# Patient Record
Sex: Male | Born: 1965 | Hispanic: Yes | Marital: Married | Smoking: Never smoker
Health system: Southern US, Community
[De-identification: ages and names within clinical notes are randomized; demographics above are authoritative.]

## PROBLEM LIST (undated history)

## (undated) DIAGNOSIS — R7302 Impaired glucose tolerance (oral): Secondary | ICD-10-CM

## (undated) DIAGNOSIS — E785 Hyperlipidemia, unspecified: Secondary | ICD-10-CM

## (undated) DIAGNOSIS — I10 Essential (primary) hypertension: Secondary | ICD-10-CM

## (undated) HISTORY — DX: Impaired glucose tolerance (oral): R73.02

## (undated) HISTORY — DX: Essential (primary) hypertension: I10

## (undated) HISTORY — DX: Hyperlipidemia, unspecified: E78.5

---

## 2010-04-07 ENCOUNTER — Inpatient Hospital Stay (HOSPITAL_COMMUNITY)
Admission: EM | Admit: 2010-04-07 | Discharge: 2010-04-20 | Payer: Self-pay | Source: Home / Self Care | Attending: Internal Medicine | Admitting: Internal Medicine

## 2010-04-07 ENCOUNTER — Encounter (INDEPENDENT_AMBULATORY_CARE_PROVIDER_SITE_OTHER): Payer: Self-pay | Admitting: *Deleted

## 2010-04-08 ENCOUNTER — Encounter (INDEPENDENT_AMBULATORY_CARE_PROVIDER_SITE_OTHER): Payer: Self-pay | Admitting: *Deleted

## 2010-04-10 ENCOUNTER — Encounter (INDEPENDENT_AMBULATORY_CARE_PROVIDER_SITE_OTHER): Payer: Self-pay | Admitting: Internal Medicine

## 2010-04-12 ENCOUNTER — Encounter (INDEPENDENT_AMBULATORY_CARE_PROVIDER_SITE_OTHER): Payer: Self-pay | Admitting: *Deleted

## 2010-04-12 ENCOUNTER — Encounter: Payer: Self-pay | Admitting: Internal Medicine

## 2010-04-12 ENCOUNTER — Encounter (INDEPENDENT_AMBULATORY_CARE_PROVIDER_SITE_OTHER): Payer: Self-pay | Admitting: Internal Medicine

## 2010-04-14 ENCOUNTER — Encounter: Payer: Self-pay | Admitting: Internal Medicine

## 2010-04-18 ENCOUNTER — Encounter (INDEPENDENT_AMBULATORY_CARE_PROVIDER_SITE_OTHER): Payer: Self-pay | Admitting: *Deleted

## 2010-04-18 LAB — BASIC METABOLIC PANEL
BUN: 7 mg/dL (ref 6–23)
CO2: 25 mEq/L (ref 19–32)
Calcium: 7.8 mg/dL — ABNORMAL LOW (ref 8.4–10.5)
Chloride: 105 mEq/L (ref 96–112)
Creatinine, Ser: 0.58 mg/dL (ref 0.4–1.5)
GFR calc Af Amer: >60 mL/min (ref >60)
GFR calc non Af Amer: >60 mL/min (ref >60)
Glucose, Bld: 117 mg/dL — ABNORMAL HIGH (ref 70–99)
Potassium: 3.6 mEq/L (ref 3.5–5.1)
Sodium: 137 mEq/L (ref 135–145)

## 2010-04-18 LAB — CBC
HCT: 30.6 % — ABNORMAL LOW (ref 39.0–52.0)
Hemoglobin: 10.3 g/dL — ABNORMAL LOW (ref 13.0–17.0)
MCH: 30.7 pg (ref 26.0–34.0)
MCHC: 33.7 g/dL (ref 30.0–36.0)
MCV: 91.1 fL (ref 78.0–100.0)
Platelets: 493 10*3/uL — ABNORMAL HIGH (ref 150–400)
RBC: 3.36 MIL/uL — ABNORMAL LOW (ref 4.22–5.81)
RDW: 14.3 % (ref 11.5–15.5)
WBC: 14.9 10*3/uL — ABNORMAL HIGH (ref 4.0–10.5)

## 2010-04-18 LAB — GLUCOSE, CAPILLARY
Glucose-Capillary: 121 mg/dL — ABNORMAL HIGH (ref 70–99)
Glucose-Capillary: 139 mg/dL — ABNORMAL HIGH (ref 70–99)
Glucose-Capillary: 118 mg/dL — ABNORMAL HIGH (ref 70–99)

## 2010-04-18 LAB — HEPATIC FUNCTION PANEL
ALT: 40 U/L (ref 0–53)
AST: 37 U/L (ref 0–37)
Albumin: 2 g/dL — ABNORMAL LOW (ref 3.5–5.2)
Alkaline Phosphatase: 138 U/L — ABNORMAL HIGH (ref 39–117)
Bilirubin, Direct: 0.3 mg/dL (ref 0.0–0.3)
Indirect Bilirubin: 0.4 mg/dL (ref 0.3–0.9)
Total Bilirubin: 0.7 mg/dL (ref 0.3–1.2)
Total Protein: 5.5 g/dL — ABNORMAL LOW (ref 6.0–8.3)

## 2010-04-19 ENCOUNTER — Encounter (INDEPENDENT_AMBULATORY_CARE_PROVIDER_SITE_OTHER): Payer: Self-pay | Admitting: *Deleted

## 2010-04-19 LAB — CBC
HCT: 32 % — ABNORMAL LOW (ref 39.0–52.0)
Hemoglobin: 10.9 g/dL — ABNORMAL LOW (ref 13.0–17.0)
MCH: 31.1 pg (ref 26.0–34.0)
MCHC: 34.1 g/dL (ref 30.0–36.0)
MCV: 91.2 fL (ref 78.0–100.0)
Platelets: 599 10*3/uL — ABNORMAL HIGH (ref 150–400)
RBC: 3.51 MIL/uL — ABNORMAL LOW (ref 4.22–5.81)
RDW: 14.3 % (ref 11.5–15.5)
WBC: 15.6 10*3/uL — ABNORMAL HIGH (ref 4.0–10.5)

## 2010-04-19 LAB — PHOSPHORUS: Phosphorus: 4.4 mg/dL (ref 2.3–4.6)

## 2010-04-19 LAB — GLUCOSE, CAPILLARY: Glucose-Capillary: 131 mg/dL — ABNORMAL HIGH (ref 70–99)

## 2010-04-19 LAB — MAGNESIUM: Magnesium: 2.3 mg/dL (ref 1.5–2.5)

## 2010-04-20 ENCOUNTER — Encounter (INDEPENDENT_AMBULATORY_CARE_PROVIDER_SITE_OTHER): Payer: Self-pay | Admitting: *Deleted

## 2010-04-20 LAB — CBC
HCT: 30.3 % — ABNORMAL LOW (ref 39.0–52.0)
Hemoglobin: 10.2 g/dL — ABNORMAL LOW (ref 13.0–17.0)
MCH: 30.9 pg (ref 26.0–34.0)
MCHC: 33.7 g/dL (ref 30.0–36.0)
MCV: 91.8 fL (ref 78.0–100.0)
Platelets: 729 10*3/uL — ABNORMAL HIGH (ref 150–400)
RBC: 3.3 MIL/uL — ABNORMAL LOW (ref 4.22–5.81)
RDW: 14.5 % (ref 11.5–15.5)
WBC: 19.4 10*3/uL — ABNORMAL HIGH (ref 4.0–10.5)

## 2010-04-20 LAB — CMV IGM: CMV IgM: 0.12 (ref <0.90)

## 2010-04-20 LAB — CYTOMEGALOVIRUS ANTIBODY, IGG: Cytomegalovirus(CMV) Antibody, IgG: POSITIVE — AB

## 2010-04-23 ENCOUNTER — Encounter (INDEPENDENT_AMBULATORY_CARE_PROVIDER_SITE_OTHER): Payer: Self-pay | Admitting: *Deleted

## 2010-04-30 LAB — QUANTIFERON TB GOLD ASSAY (BLOOD)
Mitogen Minus Nil Value: 0.02 IU/mL
Quantiferon Nil Value: 0.18 IU/mL
TB Antigen Minus Nil Value: <0 IU/mL

## 2010-04-30 LAB — CMV (CYTOMEGALOVIRUS) DNA ULTRAQUANT, PCR: CMV DNA Quant: <200 copies/mL (ref <200)

## 2010-04-30 LAB — ANTI-NEUTROPHIL ANTIBODY

## 2010-05-17 NOTE — Letter (Signed)
Summary: New Patient letter  [facility]  [address] [place], [place] [address]   Phone: [phone]  Fax: [phone]       04/20/2010 MRN: [id number]  Edward Vasquez [address] [place], [place]  [address]  Dear Edward Vasquez,  Welcome to the Gastroenterology Division at [facility].    You are scheduled to see Dr.  Claudette Head on May 29, 2010 at 11:00am on the 3rd floor at [facility], [address]  We ask that you try to arrive at our office 15 minutes prior to your appointment time to allow for check-in.  We would like you to complete the enclosed self-administered evaluation form prior to your visit and bring it with you on the day of your appointment.  We will review it with you.  Also, please bring a complete list of all your medications or, if you prefer, bring the medication bottles and we will list them.  Please bring your insurance card so that we may make a copy of it.  If your insurance requires a referral to see a specialist, please bring your referral form from your primary care physician.  Co-payments are due at the time of your visit and may be paid by cash, check or credit card.     Your office visit will consist of a consult with your physician (includes a physical exam), any laboratory testing he/she may order, scheduling of any necessary diagnostic testing (e.g. x-ray, ultrasound, CT-scan), and scheduling of a procedure (e.g. Endoscopy, Colonoscopy) if required.  Please allow enough time on your schedule to allow for any/all of these possibilities.    If you cannot keep your appointment, please call [phone] to cancel or reschedule prior to your appointment date.  This allows [place] the opportunity to schedule an appointment for another patient in need of care.  If you do not cancel or reschedule by 5 p.m. the business day prior to your appointment date, you will be charged a $50.00 late cancellation/no-show fee.    Thank you for  choosing [facility] for your medical needs.  We appreciate the opportunity to care for you.  Please visit [place] at our website  to learn more about our practice.                     Sincerely,                                                             The Gastroenterology Division

## 2010-05-17 NOTE — Procedures (Signed)
Summary: Upper Endoscopy  Patient: Edward Vasquez Note: All result statuses are Final unless otherwise noted.  Tests: (1) Upper Endoscopy (EGD)   EGD Upper Endoscopy       DONE     [facility]     [address] [place], [place]  [address]           ENDOSCOPY PROCEDURE REPORT           PATIENT:  [name], [name]  MR#:  [id number]     BIRTHDATE:  [birth year], 44 yrs. old  GENDER:  male           ENDOSCOPIST:  Hedwig Morton. Juanda Chance, MD     Referred by:           PROCEDURE DATE:  04/12/2010     PROCEDURE:  EGD with biopsy, 19147     ASA CLASS:  Class II     INDICATIONS:  GERD, hematemesis           MEDICATIONS:   Versed 9 mg, Fentanyl 100 mcg, Benadryl 25 mg     TOPICAL ANESTHETIC:  Cetacaine Spray           DESCRIPTION OF PROCEDURE:   After the risks benefits and     alternatives of the procedure were thoroughly explained, informed     consent was obtained.  The  endoscope was introduced through the     mouth and advanced to the second portion of the duodenum, without     limitations.  The instrument was slowly withdrawn as the mucosa     was fully examined.     <<PROCEDUREIMAGES>>           Multiple ulcers were found in the descending duodenum. multiple     shallow ulcerations in the descending duodenum, not bleeding     Multiple biopsies were obtained and sent to pathology (see image1,     image2, and image3).  gastric varices (see image5). NG tube     induced gastritis  Esophagitis was found in the distal esophagus     (see image7 and image6).    Retroflexed views revealed no     abnormalities.    The scope was then withdrawn from the patient     and the procedure completed.           COMPLICATIONS:  None           ENDOSCOPIC IMPRESSION:     1) Ulcers, multiple in the descending duodenum     2) Gastric varices     3) Esophagitis in the distal esophagus     RECOMMENDATIONS:     1) Await biopsy results     D/C NG tube, PPI, Colonoscopy           REPEAT EXAM:  In 0  year(s) for.           ______________________________     Hedwig Morton. Juanda Chance, MD           CC:           n.     eSIGNED:   Hedwig Morton. Sumie Remsen at 04/12/2010 02:18 PM           Dorthy Cooler, [id number]  Note: An exclamation mark (!) indicates a result that was not dispersed into the flowsheet. Document Creation Date: 04/12/2010 2:18 PM _______________________________________________________________________  (1) Order result status: Final Collection or observation date-time: 04/12/2010 14:13 Requested date-time:  Receipt date-time:  Reported date-time:  Referring Physician:   Ordering Physician: Lina Sar [phone]) Specimen Source:  Source: Launa Grill Order Number: [id number] Lab site:

## 2010-05-17 NOTE — Procedures (Signed)
Summary: Colonoscopy  Patient: [name] Note: All result statuses are Final unless otherwise noted.  Tests: (1) Colonoscopy (COL)   COL Colonoscopy           DONE     [facility]     [address] [place], [place]  [address]           COLONOSCOPY PROCEDURE REPORT           PATIENT:  Edward Vasquez, Edward Vasquez  MR#:  [id number]     BIRTHDATE:  [birth year], 44 yrs. old  GENDER:  male     ENDOSCOPIST:  Hedwig Morton. Juanda Chance, MD     REF. BY:     PROCEDURE DATE:  04/12/2010     PROCEDURE:  Colonoscopy with biopsy     ASA CLASS:  Class II     INDICATIONS:  unexplained diarrhea, hematochezia, Abnormal CT of     abdomen, Abdominal pain     MEDICATIONS:   Versed 2 mg, Fentanyl 50 mcg, Benadryl 25 mg           DESCRIPTION OF PROCEDURE:   After the risks benefits and     alternatives of the procedure were thoroughly explained, informed     consent was obtained.  Digital rectal exam was performed and     revealed no rectal masses.   The  endoscope was introduced through     the anus and advanced to the cecum, which was identified by both     the appendix and ileocecal valve, without limitations.  The     quality of the prep was none.  The instrument was then slowly     withdrawn as the colon was fully examined.     <<PROCEDUREIMAGES>>           FINDINGS:  Colitis was found. patchy erythema, throughout,     discrete ulcerations at the ileocecal valve, lot of tissue and     blood tinged mucous from TIleum which could not be entered     Multiple biopsies were obtained and sent to pathology (see image1,     image2, image3, image4, image5, and image6).   Retroflexed views     in the rectum revealed no abnormalities.    The scope was then     withdrawn from the patient and the procedure completed.           COMPLICATIONS:  None     ENDOSCOPIC IMPRESSION:     1) Colitis     acute colitis, and suspected enteritis, TI unable to intubate,     picture not suggestive of Crohn's disease  RECOMMENDATIONS:     1) Await pathology results     see chart for recommendations     REPEAT EXAM:  In 0 year(s) for.           ______________________________     Hedwig Morton. Juanda Chance, MD           CC:           n.     eSIGNED:   Hedwig Morton. Rhonin Trott at 04/12/2010 03:02 PM           Dorthy Cooler, [id number]  Note: An exclamation mark (!) indicates a result that was not dispersed into the flowsheet. Document Creation Date: 04/12/2010 3:02 PM _______________________________________________________________________  (1) Order result status: Final Collection or observation date-time: 04/12/2010 14:46 Requested date-time:  Receipt date-time:  Reported date-time:  Referring Physician:   Ordering  Physician: Lina Sar [id number]) Specimen Source:  Source: Launa Grill Order Number: [id number] Lab site:

## 2010-05-19 NOTE — Consult Note (Signed)
  Edward Vasquez, Edward Vasquez NO.:  [id number]  MEDICAL RECORD NO.:  [id number]          PATIENT TYPE:  INP  LOCATION:  1516                         FACILITY[facility]  [facility]  PHYSICIAN:  Acey Lav, MD  DATE OF BIRTH:  03-Sep-1965  DATE OF CONSULTATION: DATE OF DISCHARGE:                                CONSULTATION   PROCEDURE:  Punch biopsy.  PERMISSION:  The patient's informed consent was obtained after the risks and benefits of the procedure were thoroughly explained to him.  INDICATIONS:  This is to try and diagnose what the cause of his petechial purpuric rash and systemic illness is.  DESCRIPTION OF PROCEDURE:  The patient's left medial thigh was prepped in the usual sterile manner with Betadine, 1% lidocaine was used to infiltrate three different sites in the thigh.  Punch biopsies were obtained from three different sites.  The first punch biopsy was obtained.  The tissue seemed to have been lost within one of the instruments, therefore, we had to do two additional biopsies.  The second punch biopsy was obtained and put into a sterile container to be sent for AFB, fungal and bacterial cultures.  Final specimen taken was sent to the Pathology laboratory for pathological examination.  The patient had minimal blood loss.  COMPLICATIONS:  None.     Acey Lav, MD     CV/MEDQ  D:  04/09/2010  T:  04/09/2010  Job:  [id number]  Electronically Signed by Paulette Blanch DAM MD on 05/19/2010 09:52:55 AM

## 2010-05-19 NOTE — Consult Note (Signed)
Edward, Vasquez NO.:  [id number]  MEDICAL RECORD NO.:  [id number]          PATIENT TYPE:  INP  LOCATION:  1516                         FACILITY:  [facility]  PHYSICIAN:  Acey Lav, MD  DATE OF BIRTH:  Nov 02, 1965  DATE OF CONSULTATION:  04/08/2010 DATE OF DISCHARGE:                                CONSULTATION   REQUESTING PHYSICIAN:  Lonia Blood, MD.  REASON FOR INFECTIOUS DISEASE CONSULTATION:  The patient with fever, petechial/purpuric rash, abdominal pain.  Findings concerning for possible Crohn's disease and bloody bowel movements.  HISTORY OF PRESENT ILLNESS:  Edward Vasquez is a 45 year old [place] American gentleman who immigrated to the [place] about 24 years ago, who works in [employer].  He states that in 1990, he has had a gastrointestinal infection but otherwise had no chronic gastrointestinal complaints.  This past Monday, he developed acute onset of a rash in his lower extremities, was preceded by cramping sensation in his calves and then he developed a petechial rash in that area, which then ascended. He was seen in [facility] on Tuesday and given a shot of cortisone.  He was also sent home on some prednisone on December 20.  He felt initial improvement in his rash after the injection of cortisone but this was short-lived.  He continued to feel poorly and the rash worsened.  He was then seen on December 22, again in Urgent Care, and given doxycycline as well as Lortab.  Despite the doxycycline that he then took, he continued to feel poorly and the rash ascended from his lower extremities up his thighs now involving his arms, sparing the palms of his hands, although with some solar involvement.  He then also began to develop fever as well as lower abdominal pain that was cramping as well as bloody bowel movements up to 3 or 4 per day and this began on Christmas Eve on the 24th.  He came the emergency department for evaluation.   He then had a workup done which included abdominal films which suggested a possible left retrocardiac, left lower lobe infiltrate on chest x-ray.  He had a CT of the abdomen and pelvis done as well that evening.  This showed edema in a lengthy section of the distal and terminal ileum with stranding of the surrounding fat, conspicuous for possible inflammatory bowel disease versus an infectious etiology.  He had some free fluid in the pelvis and also some atelectasis of the left lower lobe and a small pleural effusion.  Radiology felt that it could not exclude early pneumonia based on the CT scan yet.  The patient's pertinent labs showed him to have a  normal white count of 8.5, normal hemoglobin, platelets.  Slightly elevated lactic acid at 2.7, normal procalcitonin.  He was initiated on vancomycin and Zosyn in the emergency room, then changed over to cefepime and intravenous doxycycline.  Of note, the patient relates to me additional history of having transient severe muscle weakness to the point where he could not walk.  He states that his wife and friends had to "carry him" to get  him to the Urgent Care on Wednesday.  This muscle weakness has since improved but it was quite severe earlier in the week.  The patient has had laboratory testing done so far including HIV antibody which has been negative, and syphilis titer which was negative.  We were asked to assist in the workup of this patient with fever and petechial/purpuric rash, abdominal pain, findings concerning for enteritis in the terminal ileum, as well as possible left lower lobe infiltrate concerning for pneumonia.  PAST MEDICAL HISTORY: 1. Isolated episode of diarrheal illness in [place] in the 1990s. 2. Hypertension.  ALLERGIES:  NO KNOWN DRUG ALLERGIES.  MEDICATIONS:  Medications prior to arrival in the hospital here at [facility] included the prednisone that he had been given, doxycycline, Nexium, Norvasc and  Vicodin.  MEDICATIONS IN-HOUSE:  At present include normal saline, Dilaudid, Zofran, and oxycodone.  IMPRESSION:  This is a complicated 45 year old Hispanic gentleman with seemingly no significant past medical history who presents with 1-week history of an ascending petechial/purpuric rash that began in his lower calves and ascended upwards, involved thighs and arms, also accompanied by transient severe weakness, which was treated with steroids and doxycycline at [facility].  Despite this, the patient had worsening of the rash as well as new onset of abdominal pain and fever Saturday, found on imaging to have a left retrocardiac opacity on chest x-ray, concerning for atelectasis versus pneumonia and found to have thickening of the terminal ileum, seen on CT scan of the abdomen and pelvis. 1. Fever with petechial rash, infiltrate on chest x-ray, diffuse     ileitis seen on CT scan, bloody diarrhea, also with history of     transient severe lower extremity weakness.  It is not clear what     the unifying diagnosis would be in this gentleman at this point in     time.  Certainly, Campylobacter infection could cause an enteritis,     could be accompanied by a rash and could also result in Guillain-     Barre syndrome.  The timing, however, does not match up in the     classic manner in that his rash preceded his development of     gastrointestinal symptoms.  He did also have transient episodes of     severe muscle weakness.  Certainly, given the petechial and     purpuric nature of the rash, I certainly would have agreed with     treating the patient with doxycycline particularly in a month where     RMSF would be much more likely to be in play.  However, this     patient has failed to improve despite doxycycline which would go     against the diagnosis of [facility] spotted fever.  His     laboratory findings are also not suggestive of [facility]     spotted fever with  normal white count, normal hemoglobin and normal     platelets.  Similarly, ehrlichiosis seems unlikely.  Certainly,     given the petechial nature of the rash, I would be concerned about     meningococcus or disseminated gonococcal infection or other severe     disseminated infections such as infection with group A strep or     pneumococcus.  He does not seem to have much of the risk factors     for meningococcemia and does not have much in the way of headache     at this point in  time although he states he has had some stiff neck     over the last several weeks.  Disseminated gonococcal disease is     possible although I do not know how to fit the gastrointestinal     complaints into that illness other than if he had a severe     bacteremia and sepsis.  Certainly, pneumococcal infection remains     in the differential given the left retrocardiac opacity and high     fevers.  Other possibilities would include inflammatory bowel     disease.  Inflammatory bowel disease can present with a rash     although usually with erythema nodosum rather than a     petechial/purpuric rash.  However, this is in the differential.  It     is unusual that he has had no similar episodes other than one in     1990s.  Certainly an autoimmune condition is possible.  His     sedimentation rate is not terribly impressive, but something such     as dermatomyositis would be in the differential although he does     not have classic heliotrope rash.  For the time being, I would     recommend following with the patient off antibiotics.  First reason     for this is that I do not know what we  are treating.  Certainly,     should he have infection with SHIGA toxin-producing Escherichia     coli or shigellosis, antibiotics should make this worse.  He has     received some antibiotics in the ER and this has not yet pushed him     towards hemolytic-uremic syndrome.  However, I think we will follow     him off  antibiotics for now.  Certainly, should his condition     deteriorate, I would then give him antibiotics and would likely     give him Rocephin and Flagyl.  This would cover pneumococcus,     disseminated meningococcal  infection and disseminated gonococcal     infection, and as well as gastrointestinal pathogens.  I think     [facility] spotted fever and Ehrlichia are unlikely given the     lack of response to doxycycline.  I will send off an HIV RNA.     Hepatitis serologies have been sent.  I will send off Epstein-Barr     virus and cytomegalovirus antibody.  I will recheck a procalcitonin     the morning.  I will check a urine pneumococcal antigen.  I will     check urine gonococcal and Chlamydia.  I will check QuantiFERON-     Gold to look for possible tuberculosis infection of the terminal     ileum.  I will recheck a chest x-ray in the morning.  I would like     to get an MRI to better evaluate this transient weakness that he     has as well.  I will check ANCA, ferritin, and anti-Saccharomyces     cerevisiae antibody. 2. Bloody diarrhea.  Imaging shows inflammation in the ileum.     Differential does include inflammatory bowel diseases, does include     infection including pseudo tuberculosis with infection with     yersinia, also does include tuberculosis itself.  Infection with     enteric pathogens such as Escherichia coli and SHIGA toxin-     producing Escherichia coli are possible as is Campylobacter,  Salmonella.  I agree with getting stool cultures along with stool     white blood cells.  This patient ultimately may need an upper     endoscopy with push through versus colonoscopy to better the     evaluate the terminal ileum.  Again, for now I would withhold     antibiotics and observe the patient. 3. Ascending petechial rash.  Again as mentioned above, differential     for this is broad.  Does include infectious etiology that has been     discussed, such as severe  pneumococcal infection, severe gonococcal     infection, severe rickettsial infection.  It also includes other     infectious diseases such as HIV or hepatitis C, Epstein-Barr virus,     cytomegalovirus.  Certainly, autoimmune disease does need to be     considered.  Something such as dermatomyositis would be in the     differential as discussed above and labs are going to be send off     for this, including a CPK, aldolase and LDH.  He may benefit from a     biopsy of the rash, although at this point in time I do not know     for sure much beyond the petechiae that are present.  He might     benefit from a dermatology consult though. 4. Transient weakness of lower extremities.  I will get an MRI of the     lower lumbar spine to better evaluate this.  It is not clear what     the cause of this was or this is just severe weakness from     infection and dehydration or whether he might have had some     transient transverse myelitis or other process that might respond     to steroids.  The clinical picture is certainly confusing.  Thank you for the Infectious Disease consultation.  I will follow along closely.  I will be here tomorrow and then Dr. Orvan Falconer will be here on Tuesday.     Acey Lav, MD     CV/MEDQ  D:  04/08/2010  T:  04/08/2010  Job:  [id number]  cc:   Lonia Blood, M.D.  Barbette Hair. Arlyce Dice, MD,FACG [address] [place] [address]  Electronically Signed by Paulette Blanch DAM MD on 05/19/2010 09:52:46 AM

## 2010-05-29 ENCOUNTER — Ambulatory Visit: Payer: Self-pay | Admitting: Gastroenterology

## 2010-05-29 ENCOUNTER — Ambulatory Visit (INDEPENDENT_AMBULATORY_CARE_PROVIDER_SITE_OTHER): Payer: BC Managed Care – PPO | Admitting: Gastroenterology

## 2010-05-29 ENCOUNTER — Encounter: Payer: Self-pay | Admitting: Gastroenterology

## 2010-05-29 DIAGNOSIS — K5289 Other specified noninfective gastroenteritis and colitis: Secondary | ICD-10-CM

## 2010-05-29 DIAGNOSIS — K644 Residual hemorrhoidal skin tags: Secondary | ICD-10-CM | POA: Insufficient documentation

## 2010-05-31 NOTE — Discharge Summary (Signed)
Summary: Small bowel enteritis with jejunitis    NAME:  Edward Vasquez, Edward Vasquez                  ACCOUNT NO.:  [id number]      MEDICAL RECORD NO.:  [id number]          PATIENT TYPE:  INP      LOCATION:  1319                         FACILITY:  [facility]      PHYSICIAN:  Lonia Blood, M.D.       DATE OF BIRTH:  20-Jul-1965      DATE OF ADMISSION:  04/07/2010   DATE OF DISCHARGE:  04/20/2010                                  DISCHARGE SUMMARY         DISCHARGE DIAGNOSES:   1. Small bowel enteritis with jejunitis, ileitis, duodenitis with       severe bleeding and inflammation.  Pathology report indicating       inflamed small bowel type mucosa with ischemic-type changes with       overall features favoring an ischemic process.  Negative for       chronic inflammatory bowel disease.   2. Rash of unclear etiology.  Biopsy indicating perivascular       neutrophilic dermatitis. Question Sweet syndrome.   3. Hypertension.      DISCHARGE MEDICATIONS:   1. Prednisone taper 40 mg for 3 days, 20 mg for 3 days, 10 mg for 3       days.   2. Amlodipine 5 mg daily.   3. Hydrocodone/APAP 5/500 mg 1-2 tablets every 6 hours as needed for       pain.   4. Nexium 40 mg daily.   5. Zyrtec 10 mg daily.      CONDITION ON DISCHARGE:  [name] was discharged in good condition.  He   will follow up with Dr. Kellie Simmering from Rheumatology and Dr. Russella Dar from   Gastroenterology.      HISTORY AND PHYSICAL:  Refer to dictated H and P note done by Dr.   Della Goo.      HOSPITAL COURSE:  [name] was admitted to [facility] and   throughout this admission, he had a course initially of worsening   symptoms.  His initial CT scan of the abdomen done on April 07, 2010,   revealed presence of mucosal edema in the terminal ileum.  He had done   repeat CT scan of his abdomen on April 14, 2010, it was negative for   superior mesenteric artery thrombi or superior mesenteric vein clots.   He was noticed that small  bowel enteritis as progress from the ileum to   also the jejunum and duodenum.  The patient was seen in consultation by   Dr. Daiva Eves from Infectious Disease, Dr. Lina Sar from   Gastroenterology and Dr. Stacey Drain, from Rheumatology.  He had   multiple studies done including an ANA, which resulted back negative on   April 08, 2010; a CMV panel, which was negative for IgM; [facility] spotted fever panel, which was negative; Ehrlichia panel, which   was negative; HIV RNA, which was negative; ANCA panel, which was   negative; Cryoglobulin evaluation negative; antiphospholipid  antibody   syndrome panel, which did not indicate presence of any APLA-type   antibodies, please note that this panel was initially reported as   positive, but that was a false positive and not a true positive as   confirmatory lab later did not confirm the diagnosis.  Overall though,   based on the results of the biopsies, the testing that we done and the   patient's course of illness, we felt that this was most likely a   something infections that started and reactive process resulting in this   extensive inflammatory response in him to small bowel.  The patient   mainly improve on his own without any treatment except empiric   antibiotics for an infection that we never proven to exist.  Multiple   stool cultures were negative for Shigella, Salmonella, or Campylobacter.   Hepatitis A, B, and C were also negative.  By the time, the patient was   seen by Dr. Kellie Simmering and   was started on some of prednisone, where did resolve his rash and he was   without any bloody diarrhea anymore.  The patient will follow up in the   office setting with Dr. Russella Dar and Dr. Kellie Simmering to see if symptoms recur   and if he wants any further workup.               Lonia Blood, M.D.               SL/MEDQ  D:  04/22/2010  T:  04/23/2010  Job:  [id number]      Electronically Signed by Lonia Blood M.D. on 05/02/2010 08:57:26 AM

## 2010-05-31 NOTE — Consult Note (Signed)
Summary: Abdominal pain, rash, fever    NAME:  Edward Vasquez, Edward Vasquez                  ACCOUNT NO.:  [id number]      MEDICAL RECORD NO.:  [id number]          PATIENT TYPE:  INP      LOCATION:  1319                         FACILITY:  [facility]      PHYSICIAN:  Aundra Dubin, M.D.DATE OF BIRTH:  1966/03/12      DATE OF CONSULTATION:  04/18/2010   DATE OF DISCHARGE:                                    CONSULTATION         CHIEF COMPLAINT:  Abdominal pain, rash, fever.      [name] is a 45 year old Hispanic man who became ill approximately 5   days before being admitted on April 07, 2010.  He developed a rash   and fever, then he went to an urgent care.  He was treated with a   prednisone Dosepak.  He seemed to improve some over the days.  He went   back and was treated with an antibiotic.  By the day of admission, he   was having severe abdominal pain with diarrhea.  He later had some   vomiting.  There was about 3 to 4 day episode of some bloody diarrhea.   On admission, his hemoglobin was 15.9, creatinine 0.7, AST 57, ALT 126,   negative UA, ESR 18.  He has had 2 CTs of the abdomen, the one from   April 14, 2010, showed that there was distal small bowel enteritis   that had progressed since the prior imaging.  There was also increased   intraperitoneal free fluid related to the enteritis.  He underwent   colonoscopy on April 12, 2010, showing colitis with patchy erythema   throughout with discreet ulcerations.  The EGD found multiple ulcers in   the descending duodenum and shallow ulcerations without bleeding.  Since   being in the hospital, his albumin has been as low as 1.6 and most   recently was 2.0.  Overtime, his AST has normalized to 37, ALT 40.   Creatinine 0.8, glucose 117.  His white count was 33,000 on April 13, 2010, and has decreased to 14.9 by April 18, 2010.  I have seen written   that he has a positive ANA, but I do not find the results were titer.   He also  has positive antiphospholipid antibody values but there are not   easily located in the chart.      When discussing how he is feeling at this time, he feels that his   abdomen is slightly improved and he feels the best today.  He still   rates himself only at 20% improved.  He is not eating solid food.  He is   not having any significant nausea or diarrhea.  When he presented, he   was very sore and was achy all through his body.  This is why he went to   the Urgent Care and not so much the rash.  However, when he was   admitted, it was admitted on the  abdominal pain which was the   significant symptom.  The chart documents that he has lost approximately   10 pounds over the hospitalization.  He has not been febrile for many   days.  He did have fever of 102 near the time of presentation.      PAST MEDICAL/SURGICAL HISTORY:  Hypertension.  No reported surgeries.      MEDICATIONS:  At time of admission was prednisone, doxycycline, Nexium,   amlodipine.  He is presently on Protonix, nutritional support, Vasotec   0.62 mg q.i.d., Cipro 400 mg q.12 h., Flagyl 500 mg daily, Clinimix,   TPN, oxycodone, and hydromorphone p.r.n.      DRUG INTOLERANCE:  None.      SOCIAL HISTORY:  He is married and has two children.  He is a   Corporate investment banker.  He does not smoke or drink alcohol.      FAMILY HISTORY:  Not obtained.      PHYSICAL EXAMINATION:  VITAL SIGNS:  Present temperature 98.8, pulse 89,   respirations 17, blood pressure 123/81.   GENERAL:  He is lying in bed and is comfortable and gives a good   history.   SKIN:  The only residual rash is slightly to the inner thighs, but is   essentially resolved.   HEENT:  PERRL/EOMI.  Mouth is clear.   NECK:  Negative JVD.   LUNGS:  Clear.   HEART:  Regular.   ABDOMEN:  Slightly tender in the right upper quadrant, soft.   MUSCULOSKELETAL:  The arms as well as shoulders have good range of   motion without arthritis.  Knees full range of  motion.  The ankles have   mild edema, but were nontender.   NEUROLOGIC:  Strength is 5/5.      ASSESSMENT AND PLAN:   1. Acute enterocolitis.  [name] may have a vasculitis type of       presentation.  The biopsies of the duodenum showed no malignancy.       There were features of an ischemic process.  The differential was       fairly wide, but a vasculitis could not be ruled out.  He also had       a skin biopsy, which showed a neutrophilic infiltration.  There was       some question whether this could be LCV.  He has a vasculitic       process.  However, this seems to be reactive and I do wonder if it       could be related to a viral process.  It is quite severe and I       believe that he will respond to steroids, which I am starting.  I       have cautioned him that prednisone could leave him hungry and be       associated with weight gain, which I am hoping he will not gain       weight.   2. HTN.   3. Obesity.                  ______________________________   Aundra Dubin, M.D.               WWT/MEDQ  D:  04/18/2010  T:  04/19/2010  Job:  [id number]      Electronically Signed by Edward Vasquez M.D. on 05/03/2010 08:58:20 AM

## 2010-06-06 ENCOUNTER — Encounter: Payer: Self-pay | Admitting: Internal Medicine

## 2010-06-06 NOTE — Assessment & Plan Note (Signed)
Summary: Hosp follow up   History of Present Illness Visit Type: Follow-up Visit Primary GI MD: Elie Goody MD [facility] Primary Provider: na Requesting Provider: na Chief Complaint: Hospital f/u for enteritis. Pt states that he has hemorrhoids or a rash not sure  but denies any other GI complaints   History of Present Illness:   [name] returns today with complete resolution of his gastrointestinal complaints. He has occasional slight amount of rectal burning irritation, following a bowel movement. These symptoms occur about once per week. He completed his prednisone course as prescribed. He has not returned for followup with Dr. Kellie Simmering.   GI Review of Systems      Denies abdominal pain, acid reflux, belching, bloating, chest pain, dysphagia with liquids, dysphagia with solids, heartburn, loss of appetite, nausea, vomiting, vomiting blood, weight loss, and  weight gain.      Reports hemorrhoids.     Denies anal fissure, black tarry stools, change in bowel habit, constipation, diarrhea, diverticulosis, fecal incontinence, heme positive stool, irritable bowel syndrome, jaundice, light color stool, liver problems, rectal bleeding, and  rectal pain.   Current Medications (verified): 1)  Amlodipine Besylate 5 Mg Tabs (Amlodipine Besylate) .... 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  Alcohol Use - no Illicit Drug Use - no  Review of Systems       The patient complains of allergy/sinus.  The patient denies anemia, anxiety-new, arthritis/joint pain, back pain, blood in urine, breast changes/lumps,  change in vision, confusion, cough, coughing up blood, depression-new, fainting, fatigue, fever, headaches-new, hearing problems, heart murmur, heart rhythm changes, itching, menstrual pain, muscle pains/cramps, night sweats, nosebleeds, pregnancy symptoms, shortness of breath, skin rash, sleeping problems, sore throat, swelling of feet/legs, swollen lymph glands, thirst - excessive , urination - excessive , urination changes/pain, urine leakage, vision changes, and voice change.    Vital Signs:  Patient profile:   45 year old male Height:      63 inches Weight:      217 pounds BMI:     38.58 BSA:     2.00 Pulse rate:   88 / minute Pulse rhythm:   regular BP sitting:   132 / 84  (left arm) Cuff size:   regular  Vitals Entered By: Ok Anis CMA (May 29, 2010 2:56 PM)  Physical Exam  General:  Well developed, well nourished, no acute distress. obese.   Head:  Normocephalic and atraumatic. Eyes:  PERRLA, no icterus. Mouth:  No deformity or lesions, dentition normal. Lungs:  Clear throughout to auscultation. Heart:  Regular rate and rhythm; no murmurs, rubs,  or bruits. Abdomen:  Soft, nontender and nondistended. No masses, hepatosplenomegaly or hernias noted. Normal bowel sounds. Rectal:  Small external hemorrhoid. No other lesions noted. Hemocult negative.   Psych:  Alert and cooperative. Normal mood and affect.  Impression & Recommendations:  Problem # 1:  ENTEROCOLITIS (ICD-558.9) The disorder was primarily manifested in the small bowel, but there were changes noted in the colon as well. Etilogy has not been determined. R/O infectious insult. R/O vasculitis. His symptoms have all resolved. Possible gastric varices were noted on upper endoscopy. The images contained in the endoscopy  report did not clearly show gastric varices. Pending Dr. Jiles Garter evaluation will consider EGD to reevaluate. Parts of the serologic evaluation were pending at the time of discharge, and the patient  was supposed to have followed up with Dr. Kellie Simmering but he has not yet made an appt. Further plans pending Dr. Ines Bloomer office evaluation.  Problem # 2:  HEMORRHOIDS-EXTERNAL (ICD-455.3) Small external hemorrhoids. Analpram cream b.i.d. p.r.n., and standard rectal care instructions.  Problem # 3:  SCREENING COLORECTAL-CANCER (ICD-V76.51) Screening colonoscopy at age 62, 03/2011.  Patient Instructions: 1)  Pick up your prescription at your pharmacy.  2)  We have scheduled you for a follow-up visit with Dr. Stacey Drain for 06/07/10 at 9:00am, arriving at 8:45am. There phone number is [phone] if you need to reschedule or cancel your appointment.  3)  Copy sent to : Stacey Drain, MD 4)  The medication list was reviewed and reconciled.  All changed / newly prescribed medications were explained.  A complete medication list was provided to the patient / caregiver.  Prescriptions: ANALPRAM-HC 1-2.5 % CREA (HYDROCORTISONE ACE-PRAMOXINE) use rectally two times a day  #30 x 1   Entered by:   Christie Nottingham CMA (AAMA)   Authorized by:   Meryl Dare MD [facility]   Signed by:   Christie Nottingham CMA Duncan Dull) on 05/29/2010   Method used:   Electronically to        [facility] [address] (retail)       [phone]       [place], [place]  [address]       Ph: [phone]       Fax: [phone]   RxID:   [id number]

## 2010-06-07 ENCOUNTER — Encounter: Payer: Self-pay | Admitting: Gastroenterology

## 2010-06-12 NOTE — Procedures (Signed)
Summary: Endoscopy 12/29  Endoscopy 12/29   Imported By: Florinda Marker 06/06/2010 15:27:36  _____________________________________________________________________  External Attachment:    Type:   Image     Comment:   External Document

## 2010-06-21 NOTE — Letter (Signed)
Summary: Stacey Drain MD  Stacey Drain MD   Imported By: Sherian Rein 06/14/2010 15:08:55  _____________________________________________________________________  External Attachment:    Type:   Image     Comment:   External Document

## 2010-06-25 LAB — DIFFERENTIAL
Basophils Relative: 0 % (ref 0–1)
Eosinophils Absolute: 0 10*3/uL (ref 0.0–0.7)
Eosinophils Absolute: 0 10*3/uL (ref 0.0–0.7)
Eosinophils Absolute: 0.4 10*3/uL (ref 0.0–0.7)
Eosinophils Relative: 0 % (ref 0–5)
Lymphocytes Relative: 14 % (ref 12–46)
Lymphocytes Relative: 5 % — ABNORMAL LOW (ref 12–46)
Lymphs Abs: 1.2 10*3/uL (ref 0.7–4.0)
Lymphs Abs: 1.5 10*3/uL (ref 0.7–4.0)
Monocytes Absolute: 0.5 10*3/uL (ref 0.1–1.0)
Monocytes Absolute: 1 10*3/uL (ref 0.1–1.0)
Monocytes Absolute: 2.6 10*3/uL — ABNORMAL HIGH (ref 0.1–1.0)
Monocytes Relative: 6 % (ref 3–12)
Monocytes Relative: 8 % (ref 3–12)
Neutro Abs: 10.6 10*3/uL — ABNORMAL HIGH (ref 1.7–7.7)
Neutro Abs: 6.8 10*3/uL (ref 1.7–7.7)
Neutrophils Relative %: 78 % — ABNORMAL HIGH (ref 43–77)
Neutrophils Relative %: 80 % — ABNORMAL HIGH (ref 43–77)
Neutrophils Relative %: 88 % — ABNORMAL HIGH (ref 43–77)

## 2010-06-25 LAB — COMPREHENSIVE METABOLIC PANEL
ALT: 41 U/L (ref 0–53)
ALT: 47 U/L (ref 0–53)
AST: 19 U/L (ref 0–37)
AST: 45 U/L — ABNORMAL HIGH (ref 0–37)
AST: 57 U/L — ABNORMAL HIGH (ref 0–37)
Albumin: 1.5 g/dL — ABNORMAL LOW (ref 3.5–5.2)
Albumin: 1.5 g/dL — ABNORMAL LOW (ref 3.5–5.2)
Albumin: 1.6 g/dL — ABNORMAL LOW (ref 3.5–5.2)
Albumin: 1.8 g/dL — ABNORMAL LOW (ref 3.5–5.2)
Albumin: 2.1 g/dL — ABNORMAL LOW (ref 3.5–5.2)
Alkaline Phosphatase: 63 U/L (ref 39–117)
Alkaline Phosphatase: 79 U/L (ref 39–117)
BUN: 12 mg/dL (ref 6–23)
BUN: 13 mg/dL (ref 6–23)
BUN: 15 mg/dL (ref 6–23)
BUN: 6 mg/dL (ref 6–23)
BUN: 9 mg/dL (ref 6–23)
CO2: 27 mEq/L (ref 19–32)
CO2: 28 mEq/L (ref 19–32)
Calcium: 7 mg/dL — ABNORMAL LOW (ref 8.4–10.5)
Calcium: 7.1 mg/dL — ABNORMAL LOW (ref 8.4–10.5)
Calcium: 7.1 mg/dL — ABNORMAL LOW (ref 8.4–10.5)
Calcium: 7.4 mg/dL — ABNORMAL LOW (ref 8.4–10.5)
Calcium: 7.6 mg/dL — ABNORMAL LOW (ref 8.4–10.5)
Calcium: 7.7 mg/dL — ABNORMAL LOW (ref 8.4–10.5)
Chloride: 102 mEq/L (ref 96–112)
Creatinine, Ser: 0.54 mg/dL (ref 0.4–1.5)
Creatinine, Ser: 0.59 mg/dL (ref 0.4–1.5)
Creatinine, Ser: 0.7 mg/dL (ref 0.4–1.5)
Creatinine, Ser: 0.73 mg/dL (ref 0.4–1.5)
Creatinine, Ser: 0.73 mg/dL (ref 0.4–1.5)
GFR calc Af Amer: >60 mL/min (ref >60)
GFR calc Af Amer: >60 mL/min (ref >60)
GFR calc Af Amer: >60 mL/min (ref >60)
GFR calc non Af Amer: >60 mL/min (ref >60)
GFR calc non Af Amer: >60 mL/min (ref >60)
GFR calc non Af Amer: >60 mL/min (ref >60)
Glucose, Bld: 119 mg/dL — ABNORMAL HIGH (ref 70–99)
Glucose, Bld: 128 mg/dL — ABNORMAL HIGH (ref 70–99)
Glucose, Bld: 129 mg/dL — ABNORMAL HIGH (ref 70–99)
Potassium: 3.6 mEq/L (ref 3.5–5.1)
Potassium: 3.7 mEq/L (ref 3.5–5.1)
Sodium: 135 mEq/L (ref 135–145)
Sodium: 138 mEq/L (ref 135–145)
Total Bilirubin: 1 mg/dL (ref 0.3–1.2)
Total Bilirubin: 1.5 mg/dL — ABNORMAL HIGH (ref 0.3–1.2)
Total Bilirubin: 2.4 mg/dL — ABNORMAL HIGH (ref 0.3–1.2)
Total Protein: 3.8 g/dL — ABNORMAL LOW (ref 6.0–8.3)
Total Protein: 4.2 g/dL — ABNORMAL LOW (ref 6.0–8.3)
Total Protein: 4.5 g/dL — ABNORMAL LOW (ref 6.0–8.3)
Total Protein: 4.9 g/dL — ABNORMAL LOW (ref 6.0–8.3)
Total Protein: 5.2 g/dL — ABNORMAL LOW (ref 6.0–8.3)

## 2010-06-25 LAB — CBC
HCT: 29.1 % — ABNORMAL LOW (ref 39.0–52.0)
HCT: 35.9 % — ABNORMAL LOW (ref 39.0–52.0)
HCT: 42.2 % (ref 39.0–52.0)
HCT: 43.5 % (ref 39.0–52.0)
Hemoglobin: 12.4 g/dL — ABNORMAL LOW (ref 13.0–17.0)
Hemoglobin: 12.4 g/dL — ABNORMAL LOW (ref 13.0–17.0)
Hemoglobin: 15.4 g/dL (ref 13.0–17.0)
Hemoglobin: 9.9 g/dL — ABNORMAL LOW (ref 13.0–17.0)
MCH: 30.6 pg (ref 26.0–34.0)
MCH: 30.6 pg (ref 26.0–34.0)
MCH: 30.8 pg (ref 26.0–34.0)
MCH: 31.1 pg (ref 26.0–34.0)
MCH: 31.1 pg (ref 26.0–34.0)
MCH: 31.8 pg (ref 26.0–34.0)
MCH: 31.9 pg (ref 26.0–34.0)
MCHC: 33.7 g/dL (ref 30.0–36.0)
MCHC: 33.8 g/dL (ref 30.0–36.0)
MCHC: 34.2 g/dL (ref 30.0–36.0)
MCHC: 34.5 g/dL (ref 30.0–36.0)
MCHC: 34.7 g/dL (ref 30.0–36.0)
MCHC: 35.2 g/dL (ref 30.0–36.0)
MCHC: 35.4 g/dL (ref 30.0–36.0)
MCV: 89.3 fL (ref 78.0–100.0)
MCV: 89.6 fL (ref 78.0–100.0)
MCV: 89.8 fL (ref 78.0–100.0)
MCV: 90.6 fL (ref 78.0–100.0)
MCV: 90.7 fL (ref 78.0–100.0)
MCV: 91 fL (ref 78.0–100.0)
Platelets: 229 10*3/uL (ref 150–400)
Platelets: 302 10*3/uL (ref 150–400)
Platelets: 313 10*3/uL (ref 150–400)
Platelets: 332 10*3/uL (ref 150–400)
Platelets: 393 10*3/uL (ref 150–400)
RBC: 4 MIL/uL — ABNORMAL LOW (ref 4.22–5.81)
RDW: 13.7 % (ref 11.5–15.5)
RDW: 13.9 % (ref 11.5–15.5)
RDW: 14.2 % (ref 11.5–15.5)
RDW: 14.6 % (ref 11.5–15.5)
RDW: 14.8 % (ref 11.5–15.5)
WBC: 13.5 10*3/uL — ABNORMAL HIGH (ref 4.0–10.5)
WBC: 27.7 10*3/uL — ABNORMAL HIGH (ref 4.0–10.5)
WBC: 30.8 10*3/uL — ABNORMAL HIGH (ref 4.0–10.5)
WBC: 8.5 10*3/uL (ref 4.0–10.5)

## 2010-06-25 LAB — FUNGUS CULTURE W SMEAR

## 2010-06-25 LAB — CULTURE, BLOOD (ROUTINE X 2)
Culture  Setup Time: 201112250026
Culture  Setup Time: 201112251044
Culture  Setup Time: 201112261527
Culture: NO GROWTH
Culture: NO GROWTH

## 2010-06-25 LAB — PHOSPHORUS
Phosphorus: 3.5 mg/dL (ref 2.3–4.6)
Phosphorus: 3.9 mg/dL (ref 2.3–4.6)

## 2010-06-25 LAB — BASIC METABOLIC PANEL
BUN: 12 mg/dL (ref 6–23)
CO2: 26 mEq/L (ref 19–32)
Calcium: 7.3 mg/dL — ABNORMAL LOW (ref 8.4–10.5)
Chloride: 100 mEq/L (ref 96–112)
Chloride: 103 mEq/L (ref 96–112)
Glucose, Bld: 107 mg/dL — ABNORMAL HIGH (ref 70–99)
Potassium: 3.6 mEq/L (ref 3.5–5.1)
Sodium: 136 mEq/L (ref 135–145)

## 2010-06-25 LAB — ROCKY MTN SPOTTED FVR AB, IGM-BLOOD: RMSF IgM: 0.08 IV (ref 0.00–0.89)

## 2010-06-25 LAB — GLUCOSE, CAPILLARY
Glucose-Capillary: 111 mg/dL — ABNORMAL HIGH (ref 70–99)
Glucose-Capillary: 114 mg/dL — ABNORMAL HIGH (ref 70–99)
Glucose-Capillary: 114 mg/dL — ABNORMAL HIGH (ref 70–99)
Glucose-Capillary: 116 mg/dL — ABNORMAL HIGH (ref 70–99)
Glucose-Capillary: 118 mg/dL — ABNORMAL HIGH (ref 70–99)
Glucose-Capillary: 129 mg/dL — ABNORMAL HIGH (ref 70–99)
Glucose-Capillary: 132 mg/dL — ABNORMAL HIGH (ref 70–99)
Glucose-Capillary: 137 mg/dL — ABNORMAL HIGH (ref 70–99)
Glucose-Capillary: 143 mg/dL — ABNORMAL HIGH (ref 70–99)

## 2010-06-25 LAB — URINALYSIS, ROUTINE W REFLEX MICROSCOPIC
Bilirubin Urine: NEGATIVE
Glucose, UA: NEGATIVE mg/dL
Hgb urine dipstick: NEGATIVE
Ketones, ur: NEGATIVE mg/dL
Protein, ur: NEGATIVE mg/dL
Urobilinogen, UA: 1 mg/dL (ref 0.0–1.0)

## 2010-06-25 LAB — MISCELLANEOUS TEST: Miscellaneous Test: 83505

## 2010-06-25 LAB — STREP PNEUMONIAE URINARY ANTIGEN: Strep Pneumo Urinary Antigen: NEGATIVE

## 2010-06-25 LAB — HEPATITIS C ANTIBODY: HCV Ab: NEGATIVE

## 2010-06-25 LAB — EHRLICHIA ANTIBODY PANEL: E chaffeensis (HGE) Ab, IgM: <1:20 {titer}

## 2010-06-25 LAB — OVA AND PARASITE EXAMINATION

## 2010-06-25 LAB — RPR: RPR Ser Ql: NONREACTIVE

## 2010-06-25 LAB — TRIGLYCERIDES: Triglycerides: 124 mg/dL (ref <150)

## 2010-06-25 LAB — HEPATIC FUNCTION PANEL
ALT: 90 U/L — ABNORMAL HIGH (ref 0–53)
AST: 31 U/L (ref 0–37)
Albumin: 2.7 g/dL — ABNORMAL LOW (ref 3.5–5.2)
Alkaline Phosphatase: 117 U/L (ref 39–117)
Alkaline Phosphatase: 51 U/L (ref 39–117)
Bilirubin, Direct: 0.2 mg/dL (ref 0.0–0.3)
Bilirubin, Direct: 2 mg/dL — ABNORMAL HIGH (ref 0.0–0.3)
Indirect Bilirubin: 1.4 mg/dL — ABNORMAL HIGH (ref 0.3–0.9)
Total Bilirubin: 1 mg/dL (ref 0.3–1.2)
Total Bilirubin: 3.4 mg/dL — ABNORMAL HIGH (ref 0.3–1.2)
Total Protein: 4.4 g/dL — ABNORMAL LOW (ref 6.0–8.3)

## 2010-06-25 LAB — CLOSTRIDIUM DIFFICILE BY PCR: Toxigenic C. Difficile by PCR: NEGATIVE

## 2010-06-25 LAB — STOOL CULTURE

## 2010-06-25 LAB — SODIUM, STOOL: Sodium, Stl: 6.1 mEq/L

## 2010-06-25 LAB — ROCKY MTN SPOTTED FVR AB, IGG-BLOOD: RMSF IgG: 0.22 IV

## 2010-06-25 LAB — ANTIPHOSPHOLIPID SYNDROME EVAL, BLD
Anticardiolipin IgA: 1 APL U/mL — ABNORMAL LOW (ref <22)
DRVVT: 44.7 secs — ABNORMAL HIGH (ref 36.2–44.3)
Lupus Anticoagulant: DETECTED — AB
PTTLA 4:1 Mix: 50.8 secs — ABNORMAL HIGH (ref 30.0–45.6)
Phosphatydalserine, IgG: <10 U/mL (ref <10)
dRVVT Incubated 1:1 Mix: 43.2 secs (ref 36.2–44.3)

## 2010-06-25 LAB — HEMOGLOBIN AND HEMATOCRIT, BLOOD
HCT: 42.6 % (ref 39.0–52.0)
Hemoglobin: 14.9 g/dL (ref 13.0–17.0)

## 2010-06-25 LAB — EPSTEIN-BARR VIRUS VCA ANTIBODY PANEL
EBV EA IgG: 0.3 {ISR}
EBV VCA IgG: 0.26 {ISR}
EBV VCA IgM: 0.13 {ISR}

## 2010-06-25 LAB — AFB CULTURE WITH SMEAR (NOT AT ARMC)

## 2010-06-25 LAB — HEPATITIS A ANTIBODY, IGM: Hep A IgM: NEGATIVE

## 2010-06-25 LAB — HEPATITIS PANEL, ACUTE
Hep A IgM: NEGATIVE
Hep B C IgM: NEGATIVE

## 2010-06-25 LAB — FECAL FAT, QUANTITATIVE

## 2010-06-25 LAB — TYPE AND SCREEN: Antibody Screen: NEGATIVE

## 2010-06-25 LAB — HEPATITIS A ANTIBODY, TOTAL: Hep A Total Ab: POSITIVE — AB

## 2010-06-25 LAB — ABO/RH: ABO/RH(D): O POS

## 2010-06-25 LAB — MRSA PCR SCREENING: MRSA by PCR: NEGATIVE

## 2010-06-25 LAB — PREALBUMIN
Prealbumin: 6.3 mg/dL — ABNORMAL LOW (ref 18.0–45.0)
Prealbumin: 7.7 mg/dL — ABNORMAL LOW (ref 18.0–45.0)

## 2010-06-25 LAB — GC/CHLAMYDIA PROBE AMP, URINE: Chlamydia, Swab/Urine, PCR: NEGATIVE

## 2010-06-25 LAB — ANTI-NEUTROPHIL ANTIBODY

## 2010-06-25 LAB — ANA: Anti Nuclear Antibody(ANA): NEGATIVE

## 2010-06-25 LAB — CK: Total CK: 29 U/L (ref 7–232)

## 2010-06-25 LAB — FECAL LACTOFERRIN, QUANT

## 2010-06-25 LAB — PROTIME-INR: Prothrombin Time: 14.7 seconds (ref 11.6–15.2)

## 2010-06-25 LAB — CRYOGLOBULIN

## 2010-06-25 LAB — FERRITIN: Ferritin: 262 ng/mL (ref 22–322)

## 2010-07-06 ENCOUNTER — Ambulatory Visit: Payer: Self-pay | Admitting: Gastroenterology

## 2011-06-28 ENCOUNTER — Ambulatory Visit (INDEPENDENT_AMBULATORY_CARE_PROVIDER_SITE_OTHER): Payer: BC Managed Care – PPO | Admitting: Internal Medicine

## 2011-06-28 VITALS — BP 142/81 | HR 58 | Temp 98.6°F | Resp 16 | Ht 63.5 in | Wt 232.0 lb

## 2011-06-28 DIAGNOSIS — I10 Essential (primary) hypertension: Secondary | ICD-10-CM

## 2011-06-28 DIAGNOSIS — Z7189 Other specified counseling: Secondary | ICD-10-CM

## 2011-06-28 DIAGNOSIS — Z79899 Other long term (current) drug therapy: Secondary | ICD-10-CM

## 2011-06-28 LAB — BASIC METABOLIC PANEL
Chloride: 108 mEq/L (ref 96–112)
Creat: 0.77 mg/dL (ref 0.50–1.35)
Potassium: 4.2 mEq/L (ref 3.5–5.3)
Sodium: 141 mEq/L (ref 135–145)

## 2011-06-28 MED ORDER — AMLODIPINE BESYLATE 5 MG PO TABS: 10.0000 mg | ORAL_TABLET | Freq: Every day | ORAL | Status: DC

## 2011-06-28 MED ORDER — AMLODIPINE BESYLATE 10 MG PO TABS: 10.0000 mg | ORAL_TABLET | Freq: Every day | ORAL | Status: DC

## 2011-06-28 NOTE — Progress Notes (Signed)
  Subjective:    Patient ID: Edward Vasquez, male    DOB: 07/30/1965, 46 y.o.   MRN: [id number]  HPI HTN near goal. Feels good Lost 10 lbs Needs refills    Review of Systems See scanned ROS    Objective:   Physical Exam Heart RR, normal Lungs clear        Assessment & Plan:    Increase amlodipine 10mg  qd Dash diet and lose 40 lbs RTC 6 mos.

## 2011-06-28 NOTE — Patient Instructions (Signed)

## 2011-07-01 ENCOUNTER — Encounter: Payer: Self-pay | Admitting: *Deleted

## 2012-06-27 ENCOUNTER — Ambulatory Visit (INDEPENDENT_AMBULATORY_CARE_PROVIDER_SITE_OTHER): Payer: BC Managed Care – PPO | Admitting: Family Medicine

## 2012-06-27 VITALS — BP 124/82 | HR 71 | Temp 98.7°F | Resp 17 | Ht 64.0 in | Wt 236.0 lb

## 2012-06-27 DIAGNOSIS — Z7189 Other specified counseling: Secondary | ICD-10-CM

## 2012-06-27 DIAGNOSIS — I10 Essential (primary) hypertension: Secondary | ICD-10-CM

## 2012-06-27 DIAGNOSIS — H5789 Other specified disorders of eye and adnexa: Secondary | ICD-10-CM

## 2012-06-27 DIAGNOSIS — H579 Unspecified disorder of eye and adnexa: Secondary | ICD-10-CM

## 2012-06-27 DIAGNOSIS — Z131 Encounter for screening for diabetes mellitus: Secondary | ICD-10-CM

## 2012-06-27 DIAGNOSIS — H11009 Unspecified pterygium of unspecified eye: Secondary | ICD-10-CM

## 2012-06-27 DIAGNOSIS — H11002 Unspecified pterygium of left eye: Secondary | ICD-10-CM

## 2012-06-27 DIAGNOSIS — Z13220 Encounter for screening for lipoid disorders: Secondary | ICD-10-CM

## 2012-06-27 MED ORDER — AMLODIPINE BESYLATE 10 MG PO TABS: 10.0000 mg | ORAL_TABLET | Freq: Every day | ORAL | Status: DC

## 2012-06-27 NOTE — Progress Notes (Signed)
[facility]:  Office Visit  Chief Complaint:  Chief Complaint  Patient presents with  . Medication Refill  . Eye Problem    HPI: Edward Vasquez is a 47 y.o. male who complains of  HTN med refills. Compliant. No  SEs. He has blurred vision left eye.   Has left eye irritation in the nasal corner of left eye x 1 month, wakes up with crust. 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 There is a film on his eye in the AM when he wakes up, blurs his vision then clears up  No light sensitivity. No vision loss. Has tried clear eyes without relief.  Past Medical History  Diagnosis Date  . Hypertension    History reviewed. No pertinent past surgical history. History   Social History  . Marital Status: Married    Spouse Name: N/A    Number of Children: N/A  . Years of Education: N/A   Social History Main Topics  . Smoking status: Never Smoker   . Smokeless tobacco: None  . Alcohol Use: No  . Drug Use: No  . Sexually Active: Yes    Birth Control/ Protection: None   Other Topics Concern  . None   Social History Narrative  . None   No family history on file. No Known Allergies Prior to Admission medications   Medication Sig Start Date End Date Taking? Authorizing Provider  amLODipine (NORVASC) 10 MG tablet Take 1 tablet (10 mg total) by mouth daily. 06/28/11 06/27/12 Yes Jonita Albee, MD     ROS: The patient denies fevers, chills, night sweats, unintentional weight loss, chest pain, palpitations, wheezing, dyspnea on exertion, nausea, vomiting, abdominal pain, dysuria, hematuria, melena, numbness, weakness, or tingling.   All other systems have been reviewed and were otherwise negative with the exception of those mentioned in the HPI and as above.    PHYSICAL EXAM: Filed Vitals:   06/27/12 0908  BP: 124/82  Pulse: 71  Temp: 98.7 F (37.1 C)  Resp: 17   Filed Vitals:   06/27/12 0908  Height: 5\' 4"  (1.626 m)  Weight: 236 lb (107.049  kg)   Body mass index is 40.49 kg/(m^2).  General: Alert, no acute distress HEENT:  Normocephalic, atraumatic, oropharynx patent. EOMI, PERRLA, fundoscopic exam normal. Left pterygium. No corneal uptake with fluoroscein.  Cardiovascular:  Regular rate and rhythm, no rubs murmurs or gallops.  No Carotid bruits, radial pulse intact. No pedal edema.  Respiratory: Clear to auscultation bilaterally.  No wheezes, rales, or rhonchi.  No cyanosis, no use of accessory musculature GI: No organomegaly, abdomen is soft and non-tender, positive bowel sounds.  No masses. Skin: No rashes. Neurologic: Facial musculature symmetric. Psychiatric: Patient is appropriate throughout our interaction. Lymphatic: No cervical lymphadenopathy Musculoskeletal: Gait intact.   LABS: Results for orders placed in visit on 06/28/11  BASIC METABOLIC PANEL      Result Value Range   Sodium 141  135 - 145 mEq/L   Potassium 4.2  3.5 - 5.3 mEq/L   Chloride 108  96 - 112 mEq/L   CO2 23  19 - 32 mEq/L   Glucose, Bld 90  70 - 99 mg/dL   BUN 14  6 - 23 mg/dL   Creat 0.98  1.19 - 1.47 mg/dL   Calcium 9.3  8.4 - 82.9 mg/dL     EKG/XRAY:   Primary read interpreted by Dr. Conley Rolls at [facility].   ASSESSMENT/PLAN: Encounter Diagnoses  Name  Primary?  . HTN (hypertension) Yes  . Screening for diabetes mellitus   . Screening for hyperlipidemia   . Pterygium eye, left   . Irritation of left eye    Refilled Norvasc Advise patient to return for fasting blood work. He needs to get a full annual in the next few months Eye exam was normal no ulcers/abrasions but since he has had eye irritation for 1 month, he would like to get  Evaluated. Advise natural tears and also cold compresses.  Will refer to optometry/opthalmology.  F/u as directed    Edward Henrikson PHUONG, DO 06/27/2012 11:25 AM

## 2012-07-03 ENCOUNTER — Ambulatory Visit: Payer: BC Managed Care – PPO | Admitting: Family Medicine

## 2012-07-03 VITALS — BP 122/94 | HR 86 | Temp 98.5°F | Resp 16 | Ht 63.5 in | Wt 235.0 lb

## 2012-07-03 DIAGNOSIS — H11009 Unspecified pterygium of unspecified eye: Secondary | ICD-10-CM

## 2012-07-03 DIAGNOSIS — H11002 Unspecified pterygium of left eye: Secondary | ICD-10-CM

## 2012-07-03 DIAGNOSIS — H5789 Other specified disorders of eye and adnexa: Secondary | ICD-10-CM

## 2012-07-03 DIAGNOSIS — H579 Unspecified disorder of eye and adnexa: Secondary | ICD-10-CM

## 2012-07-03 DIAGNOSIS — M25569 Pain in unspecified knee: Secondary | ICD-10-CM

## 2012-07-03 DIAGNOSIS — H5712 Ocular pain, left eye: Secondary | ICD-10-CM

## 2012-07-03 DIAGNOSIS — M25561 Pain in right knee: Secondary | ICD-10-CM

## 2012-07-03 DIAGNOSIS — Z13220 Encounter for screening for lipoid disorders: Secondary | ICD-10-CM

## 2012-07-03 DIAGNOSIS — E785 Hyperlipidemia, unspecified: Secondary | ICD-10-CM

## 2012-07-03 DIAGNOSIS — H5710 Ocular pain, unspecified eye: Secondary | ICD-10-CM

## 2012-07-03 DIAGNOSIS — I10 Essential (primary) hypertension: Secondary | ICD-10-CM

## 2012-07-03 DIAGNOSIS — Z131 Encounter for screening for diabetes mellitus: Secondary | ICD-10-CM

## 2012-07-03 LAB — COMPREHENSIVE METABOLIC PANEL
AST: 22 U/L (ref 0–37)
Albumin: 4.6 g/dL (ref 3.5–5.2)
BUN: 12 mg/dL (ref 6–23)
Calcium: 9.2 mg/dL (ref 8.4–10.5)
Chloride: 105 mEq/L (ref 96–112)
Glucose, Bld: 96 mg/dL (ref 70–99)
Potassium: 4.1 mEq/L (ref 3.5–5.3)

## 2012-07-03 LAB — COMPREHENSIVE METABOLIC PANEL WITH GFR
ALT: 33 U/L (ref 0–53)
Alkaline Phosphatase: 55 U/L (ref 39–117)
CO2: 27 meq/L (ref 19–32)
Creat: 0.75 mg/dL (ref 0.50–1.35)
Sodium: 137 meq/L (ref 135–145)
Total Bilirubin: 1 mg/dL (ref 0.3–1.2)
Total Protein: 7.3 g/dL (ref 6.0–8.3)

## 2012-07-03 LAB — LIPID PANEL
Cholesterol: 172 mg/dL (ref 0–200)
HDL: 34 mg/dL — ABNORMAL LOW (ref >39)
LDL Cholesterol: 122 mg/dL — ABNORMAL HIGH (ref 0–99)
Total CHOL/HDL Ratio: 5.1 ratio
Triglycerides: 79 mg/dL (ref <150)
VLDL: 16 mg/dL (ref 0–40)

## 2012-07-03 LAB — TSH: TSH: 1.815 u[IU]/mL (ref 0.350–4.500)

## 2012-07-03 NOTE — Progress Notes (Signed)
  Subjective:    Patient ID: Edward Vasquez, male    DOB: 10-04-1965, 47 y.o.   MRN: [id number]  HPI Edward Vasquez is a 47 y.o. male See last ov - here for fasting labs for HTN.  HTN - outside Bp's 140/89.    L Eye irritation - see prior ov - sx's resolved few days ago. Di not have to see opthalmologist.   Additional concern - r knee- notes swelling at times or soreness if walking for a long time or on knees for long time. Works in [employer] representative. Popping sensation at times, but not giving way or locking. Not taking any meds for this. Not swollen now.    Review of Systems  Constitutional: Negative for fatigue and unexpected weight change.  Eyes: Negative for redness, itching and visual disturbance.       L eye redness/irritation resolved few days ago.   Respiratory: Negative for cough, chest tightness and shortness of breath.   Cardiovascular: Negative for chest pain, palpitations and leg swelling.  Gastrointestinal: Negative for abdominal pain and blood in stool.  Neurological: Negative for dizziness, light-headedness and headaches.       Objective:   Physical Exam  Constitutional: He is oriented to person, place, and time. He appears well-developed and well-nourished.  HENT:  Head: Normocephalic and atraumatic.  Eyes: EOM are normal. Pupils are equal, round, and reactive to light.  Neck: No JVD present. Carotid bruit is not present.  Cardiovascular: Normal rate, regular rhythm and normal heart sounds.   No murmur heard. Pulmonary/Chest: Effort normal and breath sounds normal. He has no rales.  Musculoskeletal: He exhibits no edema.       Right knee: He exhibits normal range of motion, no swelling, no effusion, no ecchymosis, no bony tenderness and normal meniscus. No medial joint line and no lateral joint line tenderness noted.       Legs: Neurological: He is alert and oriented to person, place, and time.  Skin: Skin is warm and dry.  Psychiatric: He has a normal mood  and affect.       Assessment & Plan:  Edward Vasquez is a 47 y.o. male  HTN (hypertension) - borderline control.  Work on weight loss.  Continue norvasc. Check labs (cmp, lipids).   Left eye pain - resolved.   Recurrent knee pain, right - ddx pfps vs oa.  Min sx's.  Deferred xr today, but rtc if incr pain or swelling. Tylenol otc ok ,and SLR HEP discussed for quad strengthening.   Patient Instructions  Tylenol if needed for your knee pain.  Work on leg exercises as discussed. Recheck if pain or swelling returns.  Your should receive a call or letter about your lab results within the next week to 10 days.  Keep a record of your blood pressures outside of the office and bring them to the next office visit.  Recheck in next 6 weeks if remaining above 140/90.  Work on diet and weight loss as this should also help your blood pressure.  Return to the clinic or go to the nearest emergency room if any of your symptoms worsen or new symptoms occur.

## 2012-07-03 NOTE — Patient Instructions (Addendum)
Tylenol if needed for your knee pain.  Work on leg exercises as discussed. Recheck if pain or swelling returns.  Your should receive a call or letter about your lab results within the next week to 10 days.  Keep a record of your blood pressures outside of the office and bring them to the next office visit.  Recheck in next 6 weeks if remaining above 140/90.  Work on diet and weight loss as this should also help your blood pressure.  Return to the clinic or go to the nearest emergency room if any of your symptoms worsen or new symptoms occur.

## 2012-07-06 ENCOUNTER — Telehealth: Payer: Self-pay | Admitting: Radiology

## 2012-07-06 NOTE — Telephone Encounter (Signed)
Message copied by Caffie Damme on Mon Jul 06, 2012 11:21 AM ------      Message from: Lenell Antu      Created: Sun Jul 05, 2012  8:35 AM       Please let patient know that:      Liver, kidneys and thyroid are normal      His cholesterol is slightly high, encourage to increase exercise and eat low fat diet.       Advise to keep his blood pressure under control, meaning less than 140/90. This will decrease his heart attack and stroke risk       Follow-up in 6 months for blood pressure or as needed.             Thanks,      Dr. Conley Rolls ------

## 2012-07-06 NOTE — Telephone Encounter (Signed)
Called patient to advise  °

## 2012-07-07 ENCOUNTER — Encounter: Payer: Self-pay | Admitting: Family Medicine

## 2013-06-27 ENCOUNTER — Ambulatory Visit: Payer: BC Managed Care – PPO | Admitting: Family Medicine

## 2013-06-27 VITALS — BP 122/70 | HR 62 | Temp 98.6°F | Resp 18 | Ht 64.0 in | Wt 240.0 lb

## 2013-06-27 DIAGNOSIS — I10 Essential (primary) hypertension: Secondary | ICD-10-CM

## 2013-06-27 DIAGNOSIS — R079 Chest pain, unspecified: Secondary | ICD-10-CM

## 2013-06-27 DIAGNOSIS — R0789 Other chest pain: Secondary | ICD-10-CM

## 2013-06-27 DIAGNOSIS — Z7189 Other specified counseling: Secondary | ICD-10-CM

## 2013-06-27 DIAGNOSIS — E7800 Pure hypercholesterolemia, unspecified: Secondary | ICD-10-CM

## 2013-06-27 LAB — COMPREHENSIVE METABOLIC PANEL
ALBUMIN: 4.4 g/dL (ref 3.5–5.2)
ALT: 33 U/L (ref 0–53)
AST: 20 U/L (ref 0–37)
Alkaline Phosphatase: 59 U/L (ref 39–117)
BUN: 11 mg/dL (ref 6–23)
CALCIUM: 9.3 mg/dL (ref 8.4–10.5)
CHLORIDE: 105 meq/L (ref 96–112)
CO2: 28 meq/L (ref 19–32)
CREATININE: 0.7 mg/dL (ref 0.50–1.35)
Glucose, Bld: 93 mg/dL (ref 70–99)
POTASSIUM: 4.1 meq/L (ref 3.5–5.3)
SODIUM: 140 meq/L (ref 135–145)
TOTAL PROTEIN: 7.4 g/dL (ref 6.0–8.3)
Total Bilirubin: 0.7 mg/dL (ref 0.2–1.2)

## 2013-06-27 LAB — POCT CBC
Granulocyte percent: 55.9 %G (ref 37–80)
HEMATOCRIT: 46.3 % (ref 43.5–53.7)
HEMOGLOBIN: 15.3 g/dL (ref 14.1–18.1)
LYMPH, POC: 2.2 (ref 0.6–3.4)
MCH, POC: 31.2 pg (ref 27–31.2)
MCHC: 33 g/dL (ref 31.8–35.4)
MCV: 94.4 fL (ref 80–97)
MID (cbc): 0.5 (ref 0–0.9)
MPV: 9.5 fL (ref 0–99.8)
POC GRANULOCYTE: 3.4 (ref 2–6.9)
POC LYMPH PERCENT: 36.7 %L (ref 10–50)
POC MID %: 7.4 % (ref 0–12)
Platelet Count, POC: 347 10*3/uL (ref 142–424)
RBC: 4.9 M/uL (ref 4.69–6.13)
RDW, POC: 15.4 %
WBC: 6.1 10*3/uL (ref 4.6–10.2)

## 2013-06-27 LAB — POCT URINALYSIS DIPSTICK
BILIRUBIN UA: NEGATIVE
GLUCOSE UA: NEGATIVE
KETONES UA: NEGATIVE
LEUKOCYTES UA: NEGATIVE
NITRITE UA: NEGATIVE
Protein, UA: NEGATIVE
Spec Grav, UA: 1.02
Urobilinogen, UA: 0.2
pH, UA: 6.5

## 2013-06-27 LAB — TSH: TSH: 3.123 u[IU]/mL (ref 0.350–4.500)

## 2013-06-27 LAB — LIPID PANEL
Cholesterol: 164 mg/dL (ref 0–200)
HDL: 37 mg/dL — AB (ref >39)
LDL CALC: 114 mg/dL — AB (ref 0–99)
Total CHOL/HDL Ratio: 4.4 Ratio
Triglycerides: 65 mg/dL (ref <150)
VLDL: 13 mg/dL (ref 0–40)

## 2013-06-27 MED ORDER — AMLODIPINE BESYLATE 10 MG PO TABS
10.0000 mg | ORAL_TABLET | Freq: Every day | ORAL | Status: DC
Start: 2013-06-27 — End: 2013-12-28

## 2013-06-27 NOTE — Progress Notes (Addendum)
Subjective:    Patient ID: Edward Vasquez, male    DOB: [birth year], 48 y.o.   MRN: [id number]  HPI Chief Complaint  Patient presents with  . rx refills    norvasc    This chart was scribed for Ethelda Chick, MD by Andrew Au, ED Scribe. This patient was seen in room 3 and the patient's care was started at 8:47 AM.  HPI Comments: Edward Vasquez is a 48 y.o. male who presents to the [facility] here for one year follow up regarding HTN. Pt has been taking HTN medication for 2 years. Pt is requesting to lower dosage from 10mg  Amlodipine. Pt checks his BP at home and states that it has been around 120. Pt reports that he has had left sided CP for one week. Pt reports that he feels pain when lifting objects and with certain movements but denies CP while walking. Pt also reports SOB intermittently but not often. He denies cough, leg swelling, nausea, emesis, heart burn and blood in stool.   Pt denies h/o surgeries. Pt was hospitalized in 2011 but is unsure of the reason. Pt last TDAP was in 2006. Pt does not want flu shot. Pt has not eaten today.   He denies smoking or drinking. Pt denies exercise other than work. Pt reports that he snores while sleeping. He denies that he stops breathing while sleeping. He reports that he gets 6-8 hours of sleep a night. Pt reports that he has BM every day. He states that he does not wake up at night to use the restroom.    Pt is from [place], married with 2 children and has been in the states since 1989. Pt denies grandchildren. Pt states that he lives with wife and children. Pt reports that his mother died at 65 from DM. He reports that she had HTN. He reports that his father is living with HTN. Pt reports that his sister has anxiety. Pt states that he has 5 brothers. He reports that one brother is no longer living.  He reports that he died from a stroke at 86 but is unsure if he had HTN.  Was last seen by Dr Neva Seat 1 year ago for  HTN.  No change in therapy at that time.  Past Medical History  Diagnosis Date  . Hypertension    No Known Allergies Prior to Admission medications   Medication Sig Start Date End Date Taking? Authorizing Provider  amLODipine (NORVASC) 10 MG tablet Take 1 tablet (10 mg total) by mouth daily. 06/27/12 06/27/13 Yes Thao P Le, DO   History   Social History  . Marital Status: Married    Spouse Name: N/A    Number of Children: N/A  . Years of Education: N/A   Occupational History  . Not on file.   Social History Main Topics  . Smoking status: Never Smoker   . Smokeless tobacco: Not on file  . Alcohol Use: No  . Drug Use: No  . Sexual Activity: Yes    Birth Control/ Protection: None   Other Topics Concern  . Not on file   Social History Narrative   Marital status: married x 19 years; from [place]; [place] since 1989.      Children:  2 children (18, 14); no grandchildren.      Lives: with wife, 2 children.       Employment: Holiday representative      Tobacco: none  Alcohol: none      Drugs: none      Exercise: work is physically demanding.   Family History  Problem Relation Age of Onset  . Diabetes Mother   . Hypertension Mother   . Hypertension Father   . Mental illness Sister   . Stroke Brother     Review of Systems  Constitutional: Negative for fever, chills, diaphoresis and fatigue.  Respiratory: Positive for shortness of breath. Negative for apnea, cough, wheezing and stridor.   Cardiovascular: Positive for chest pain. Negative for palpitations and leg swelling.  Gastrointestinal: Negative for nausea, vomiting, abdominal pain, diarrhea and blood in stool.  Endocrine: Negative for polydipsia, polyphagia and polyuria.  Genitourinary: Negative for urgency and frequency.  Neurological: Negative for dizziness, speech difficulty, weakness, light-headedness, numbness and headaches.       Objective:   Physical Exam  Nursing note and vitals reviewed. Constitutional: He is  oriented to person, place, and time. He appears well-developed and well-nourished. No distress.  obese  HENT:  Head: Normocephalic and atraumatic.  Right Ear: External ear normal.  Left Ear: External ear normal.  Eyes: Conjunctivae and EOM are normal. Pupils are equal, round, and reactive to light.  Neck: Normal range of motion. Neck supple. No JVD present. No tracheal deviation present. No thyromegaly present.  Cardiovascular: Normal rate, regular rhythm, normal heart sounds and intact distal pulses.  Exam reveals no gallop and no friction rub.   No murmur heard. Pulmonary/Chest: Effort normal and breath sounds normal. No respiratory distress. He has no wheezes. He has no rales. He exhibits tenderness.  Tenderness to palpitation left anterior chest  Abdominal: Soft. Bowel sounds are normal. He exhibits no distension. There is no tenderness. There is no rebound and no guarding.  Musculoskeletal: Normal range of motion.  Lymphadenopathy:    He has no cervical adenopathy.  Neurological: He is alert and oriented to person, place, and time. No cranial nerve deficit. Coordination normal.  Skin: Skin is warm and dry. No rash noted. He is not diaphoretic.  Psychiatric: He has a normal mood and affect. His behavior is normal.   Filed Vitals:   06/27/13 0842  BP: 122/70  Pulse: 62  Temp: 98.6 F (37 C)  TempSrc: Oral  Resp: 18  Height: 5\' 4"  (1.626 m)  Weight: 240 lb (108.863 kg)  SpO2: 98%   Results for orders placed in visit on 06/27/13  POCT CBC      Result Value Ref Range   WBC 6.1  4.6 - 10.2 K/uL   Lymph, poc 2.2  0.6 - 3.4   POC LYMPH PERCENT 36.7  10 - 50 %L   MID (cbc) 0.5  0 - 0.9   POC MID % 7.4  0 - 12 %M   POC Granulocyte 3.4  2 - 6.9   Granulocyte percent 55.9  37 - 80 %G   RBC 4.90  4.69 - 6.13 M/uL   Hemoglobin 15.3  14.1 - 18.1 g/dL   HCT, POC 40.9  81.1 - 53.7 %   MCV 94.4  80 - 97 fL   MCH, POC 31.2  27 - 31.2 pg   MCHC 33.0  31.8 - 35.4 g/dL   RDW, POC 91.4      Platelet Count, POC 347  142 - 424 K/uL   MPV 9.5  0 - 99.8 fL  POCT URINALYSIS DIPSTICK      Result Value Ref Range   Color, UA yellow  Clarity, UA clear     Glucose, UA neg     Bilirubin, UA neg     Ketones, UA neg     Spec Grav, UA 1.020     Blood, UA trace     pH, UA 6.5     Protein, UA neg     Urobilinogen, UA 0.2     Nitrite, UA neg     Leukocytes, UA Negative     EKG: NSR: no ST changes.    Assessment & Plan:   Essential hypertension, benign - Plan: POCT CBC, POCT urinalysis dipstick, Comprehensive metabolic panel, TSH  Pure hypercholesterolemia - Plan: POCT CBC, POCT urinalysis dipstick, Comprehensive metabolic panel, TSH, Lipid panel  Chest pain - Plan: EKG 12-Lead  Chest pain, muscular - Plan: EKG 12-Lead  HTN (hypertension) - Plan: amLODipine (NORVASC) 10 MG tablet  Encounter for medication review and counseling - Plan: amLODipine (NORVASC) 10 MG tablet   1. HTN:  Controlled; obtain labs, u/a, EKG; refill provided; recommend follow-up in six months. 2. Hypercholesterolemia:  Stable; obtain labs; continue with dietary modification. 3.  Chest pain: New. Secondary to chest wall strain.  EKG WNL. 4.  Chest wall strain: New.  Recommend rest, heat, stretching, avoid lifting.  Meds ordered this encounter  Medications  . amLODipine (NORVASC) 10 MG tablet    Sig: Take 1 tablet (10 mg total) by mouth daily.    Dispense:  90 tablet    Refill:  1    I personally performed the services described in this documentation, which was scribed in my presence.  The recorded information has been reviewed and is accurate.  Nilda SimmerKristi Ahmaad Neidhardt, M.D.  [facility]  [place] [address] [place], [place]  [id number] [phone] phone [phone] fax

## 2013-07-01 ENCOUNTER — Telehealth: Payer: Self-pay | Admitting: General Practice

## 2013-07-01 NOTE — Progress Notes (Signed)
Called patient no answer.

## 2013-07-01 NOTE — Telephone Encounter (Signed)
Left vm for patient to give us a call back to schedule a CPE in 6 months w/Dr Katrinka BlazingSmith

## 2013-12-27 ENCOUNTER — Telehealth: Payer: Self-pay

## 2013-12-27 DIAGNOSIS — I10 Essential (primary) hypertension: Secondary | ICD-10-CM

## 2013-12-27 DIAGNOSIS — Z7189 Other specified counseling: Secondary | ICD-10-CM

## 2013-12-27 NOTE — Telephone Encounter (Signed)
Patient request a refill on Amlodipine 10 MG tablet. [facility] [address] . Please call patient at [phone].

## 2013-12-28 MED ORDER — AMLODIPINE BESYLATE 10 MG PO TABS: 10.0000 mg | ORAL_TABLET | Freq: Every day | ORAL | Status: DC

## 2013-12-28 NOTE — Telephone Encounter (Signed)
Lm for pt- 30 day supply sent to pharmacy- needs to schedule appt

## 2014-01-12 ENCOUNTER — Encounter: Payer: Self-pay | Admitting: Family Medicine

## 2014-01-12 ENCOUNTER — Ambulatory Visit (INDEPENDENT_AMBULATORY_CARE_PROVIDER_SITE_OTHER): Payer: BC Managed Care – PPO | Admitting: Family Medicine

## 2014-01-12 VITALS — BP 128/88 | HR 66 | Temp 98.6°F | Resp 16 | Ht 63.5 in | Wt 245.0 lb

## 2014-01-12 DIAGNOSIS — E7800 Pure hypercholesterolemia, unspecified: Secondary | ICD-10-CM

## 2014-01-12 DIAGNOSIS — Z7189 Other specified counseling: Secondary | ICD-10-CM

## 2014-01-12 DIAGNOSIS — Z125 Encounter for screening for malignant neoplasm of prostate: Secondary | ICD-10-CM

## 2014-01-12 DIAGNOSIS — I10 Essential (primary) hypertension: Secondary | ICD-10-CM

## 2014-01-12 DIAGNOSIS — Z0000 Encounter for general adult medical examination without abnormal findings: Secondary | ICD-10-CM

## 2014-01-12 DIAGNOSIS — Z131 Encounter for screening for diabetes mellitus: Secondary | ICD-10-CM

## 2014-01-12 LAB — CBC WITH DIFFERENTIAL/PLATELET
BASOS ABS: 0.1 10*3/uL (ref 0.0–0.1)
Basophils Relative: 1 % (ref 0–1)
Eosinophils Absolute: 0.2 10*3/uL (ref 0.0–0.7)
Eosinophils Relative: 3 % (ref 0–5)
HEMATOCRIT: 45.5 % (ref 39.0–52.0)
Hemoglobin: 15.5 g/dL (ref 13.0–17.0)
LYMPHS ABS: 2.2 10*3/uL (ref 0.7–4.0)
LYMPHS PCT: 32 % (ref 12–46)
MCH: 30.8 pg (ref 26.0–34.0)
MCHC: 34.1 g/dL (ref 30.0–36.0)
MCV: 90.3 fL (ref 78.0–100.0)
MONO ABS: 0.4 10*3/uL (ref 0.1–1.0)
Monocytes Relative: 6 % (ref 3–12)
NEUTROS ABS: 4.1 10*3/uL (ref 1.7–7.7)
Neutrophils Relative %: 58 % (ref 43–77)
Platelets: 370 10*3/uL (ref 150–400)
RBC: 5.04 MIL/uL (ref 4.22–5.81)
RDW: 14.6 % (ref 11.5–15.5)
WBC: 7 10*3/uL (ref 4.0–10.5)

## 2014-01-12 LAB — COMPLETE METABOLIC PANEL WITH GFR
ALBUMIN: 4.6 g/dL (ref 3.5–5.2)
ALT: 31 U/L (ref 0–53)
AST: 21 U/L (ref 0–37)
Alkaline Phosphatase: 57 U/L (ref 39–117)
BUN: 9 mg/dL (ref 6–23)
CALCIUM: 9.4 mg/dL (ref 8.4–10.5)
CHLORIDE: 105 meq/L (ref 96–112)
CO2: 25 mEq/L (ref 19–32)
Creat: 0.82 mg/dL (ref 0.50–1.35)
GFR, Est African American: >89 mL/min
GFR, Est Non African American: >89 mL/min
Glucose, Bld: 90 mg/dL (ref 70–99)
POTASSIUM: 4.3 meq/L (ref 3.5–5.3)
Sodium: 140 mEq/L (ref 135–145)
Total Bilirubin: 0.6 mg/dL (ref 0.2–1.2)
Total Protein: 7.3 g/dL (ref 6.0–8.3)

## 2014-01-12 LAB — POCT URINALYSIS DIPSTICK
Bilirubin, UA: NEGATIVE
Glucose, UA: NEGATIVE
Ketones, UA: NEGATIVE
LEUKOCYTES UA: NEGATIVE
NITRITE UA: NEGATIVE
PH UA: 5.5
PROTEIN UA: NEGATIVE
Spec Grav, UA: 1.015
Urobilinogen, UA: 0.2

## 2014-01-12 LAB — LIPID PANEL
CHOL/HDL RATIO: 4.5 ratio
Cholesterol: 171 mg/dL (ref 0–200)
HDL: 38 mg/dL — AB (ref >39)
LDL CALC: 112 mg/dL — AB (ref 0–99)
Triglycerides: 103 mg/dL (ref <150)
VLDL: 21 mg/dL (ref 0–40)

## 2014-01-12 LAB — HEMOGLOBIN A1C
Hgb A1c MFr Bld: 6 % — ABNORMAL HIGH (ref <5.7)
Mean Plasma Glucose: 126 mg/dL — ABNORMAL HIGH (ref <117)

## 2014-01-12 MED ORDER — AMLODIPINE BESYLATE 10 MG PO TABS: 10.0000 mg | ORAL_TABLET | Freq: Every day | ORAL | Status: DC

## 2014-01-12 NOTE — Progress Notes (Addendum)
Subjective:    Patient ID: Edward Vasquez, male    DOB: February 13, 1966, 48 y.o.   MRN: [id number]  01/12/2014  Annual Exam, Knee Pain and Foot Pain   HPI This 48 y.o. male presents for Annual Physical Exam.  Last physical:  2014 TDAP:  2006 Pneumovax: never. Influenza:   Never/refuses. Colonoscopy:  2011 [facility] Eye exam:  2008; +glasses; no glaucoma or cataracts. Dental exam:  3 years ago; in [place].  HTN:  Patient reports good compliance with medication, good tolerance to medication, and good symptom control.  Home BP running 140/90.     Review of Systems  Constitutional: Negative for fever, chills, diaphoresis, activity change, appetite change, fatigue and unexpected weight change.  HENT: Positive for sinus pressure. Negative for congestion, dental problem, drooling, ear discharge, ear pain, facial swelling, hearing loss, mouth sores, nosebleeds, postnasal drip, rhinorrhea, sneezing, sore throat, tinnitus, trouble swallowing and voice change.   Eyes: Negative for photophobia, pain, discharge, redness, itching and visual disturbance.  Respiratory: Negative for apnea, cough, choking, chest tightness, shortness of breath, wheezing and stridor.   Cardiovascular: Negative for chest pain, palpitations and leg swelling.  Gastrointestinal: Negative for nausea, vomiting, abdominal pain, diarrhea, constipation and blood in stool.  Endocrine: Negative for cold intolerance, heat intolerance, polydipsia, polyphagia and polyuria.  Genitourinary: Negative for dysuria, urgency, frequency, hematuria, flank pain, decreased urine volume, discharge, penile swelling, scrotal swelling, enuresis, difficulty urinating, genital sores, penile pain and testicular pain.  Musculoskeletal: Negative for arthralgias, back pain, gait problem, joint swelling, myalgias, neck pain and neck stiffness.  Skin: Negative for color change, pallor, rash and wound.  Allergic/Immunologic: Positive for environmental  allergies. Negative for food allergies and immunocompromised state.  Neurological: Positive for numbness. Negative for dizziness, tremors, seizures, syncope, facial asymmetry, speech difficulty, weakness, light-headedness and headaches.  Hematological: Negative for adenopathy. Does not bruise/bleed easily.  Psychiatric/Behavioral: Negative for suicidal ideas, hallucinations, behavioral problems, confusion, sleep disturbance, self-injury, dysphoric mood, decreased concentration and agitation. The patient is not nervous/anxious and is not hyperactive.     Past Medical History  Diagnosis Date  . Hypertension    History reviewed. No pertinent past surgical history. No Known Allergies Current Outpatient Prescriptions  Medication Sig Dispense Refill  . amLODipine (NORVASC) 10 MG tablet Take 1 tablet (10 mg total) by mouth daily.  30 tablet  5   No current facility-administered medications for this visit.       Objective:    BP 128/88  Pulse 66  Temp(Src) 98.6 F (37 C) (Oral)  Resp 16  Ht 5' 3.5" (1.613 m)  Wt 245 lb (111.131 kg)  BMI 42.71 kg/m2  SpO2 97% Physical Exam  Constitutional: He is oriented to person, place, and time. He appears well-developed and well-nourished. No distress.  obese  HENT:  Head: Normocephalic and atraumatic.  Right Ear: External ear normal.  Left Ear: External ear normal.  Nose: Nose normal.  Mouth/Throat: Oropharynx is clear and moist.  Eyes: Conjunctivae and EOM are normal. Pupils are equal, round, and reactive to light.  Neck: Normal range of motion. Neck supple. Carotid bruit is not present. No thyromegaly present.  Cardiovascular: Normal rate, regular rhythm, normal heart sounds and intact distal pulses.  Exam reveals no gallop and no friction rub.   No murmur heard. Pulmonary/Chest: Effort normal and breath sounds normal. He has no wheezes. He has no rales.  Abdominal: Soft. Bowel sounds are normal. He exhibits no distension and no mass. There  is no  tenderness. There is no rebound and no guarding. Hernia confirmed negative in the right inguinal area and confirmed negative in the left inguinal area.  Genitourinary: Testes normal and penis normal. Right testis shows no mass, no swelling and no tenderness. Left testis shows no mass, no swelling and no tenderness. Circumcised.  Musculoskeletal:       Right shoulder: Normal.       Left shoulder: Normal.       Cervical back: Normal.  Lymphadenopathy:    He has no cervical adenopathy.       Right: No inguinal adenopathy present.       Left: No inguinal adenopathy present.  Neurological: He is alert and oriented to person, place, and time. He has normal reflexes. No cranial nerve deficit. He exhibits normal muscle tone. Coordination normal.  Skin: Skin is warm and dry. No rash noted. He is not diaphoretic.  Psychiatric: He has a normal mood and affect. His behavior is normal. Judgment and thought content normal.   Results for orders placed in visit on 01/12/14  CBC WITH DIFFERENTIAL      Result Value Ref Range   WBC 7.0  4.0 - 10.5 K/uL   RBC 5.04  4.22 - 5.81 MIL/uL   Hemoglobin 15.5  13.0 - 17.0 g/dL   HCT 45.5  39.0 - 52.0 %   MCV 90.3  78.0 - 100.0 fL   MCH 30.8  26.0 - 34.0 pg   MCHC 34.1  30.0 - 36.0 g/dL   RDW 14.6  11.5 - 15.5 %   Platelets 370  150 - 400 K/uL   Neutrophils Relative % 58  43 - 77 %   Neutro Abs 4.1  1.7 - 7.7 K/uL   Lymphocytes Relative 32  12 - 46 %   Lymphs Abs 2.2  0.7 - 4.0 K/uL   Monocytes Relative 6  3 - 12 %   Monocytes Absolute 0.4  0.1 - 1.0 K/uL   Eosinophils Relative 3  0 - 5 %   Eosinophils Absolute 0.2  0.0 - 0.7 K/uL   Basophils Relative 1  0 - 1 %   Basophils Absolute 0.1  0.0 - 0.1 K/uL   Smear Review Criteria for review not met    COMPLETE METABOLIC PANEL WITH GFR      Result Value Ref Range   Sodium 140  135 - 145 mEq/L   Potassium 4.3  3.5 - 5.3 mEq/L   Chloride 105  96 - 112 mEq/L   CO2 25  19 - 32 mEq/L   Glucose, Bld 90  70  - 99 mg/dL   BUN 9  6 - 23 mg/dL   Creat 0.82  0.50 - 1.35 mg/dL   Total Bilirubin 0.6  0.2 - 1.2 mg/dL   Alkaline Phosphatase 57  39 - 117 U/L   AST 21  0 - 37 U/L   ALT 31  0 - 53 U/L   Total Protein 7.3  6.0 - 8.3 g/dL   Albumin 4.6  3.5 - 5.2 g/dL   Calcium 9.4  8.4 - 10.5 mg/dL   GFR, Est African American >89     GFR, Est Non African American >89    HEMOGLOBIN A1C      Result Value Ref Range   Hemoglobin A1C 6.0 (*) <5.7 %   Mean Plasma Glucose 126 (*) <117 mg/dL  LIPID PANEL      Result Value Ref Range   Cholesterol 171  0 -  200 mg/dL   Triglycerides 103  <150 mg/dL   HDL 38 (*) >39 mg/dL   Total CHOL/HDL Ratio 4.5     VLDL 21  0 - 40 mg/dL   LDL Cholesterol 112 (*) 0 - 99 mg/dL  PSA      Result Value Ref Range   PSA 0.40  <=4.00 ng/mL  POCT URINALYSIS DIPSTICK      Result Value Ref Range   Color, UA yellow     Clarity, UA clear     Glucose, UA neg     Bilirubin, UA neg     Ketones, UA neg     Spec Grav, UA 1.015     Blood, UA trace     pH, UA 5.5     Protein, UA neg     Urobilinogen, UA 0.2     Nitrite, UA neg     Leukocytes, UA Negative         Assessment & Plan:   1. Routine general medical examination at a health care facility   2. Essential hypertension, benign   3. Pure hypercholesterolemia   4. Essential hypertension   5. Encounter for medication review and counseling   6. Screening for prostate cancer     1. Complete Physical Examination: anticipatory guidance --- weight loss, exercise.  Refused flu vaccine in office.   2.  HTN: moderately controlled; borderline normal readings; recommend weight loss, exercise, low-fat food choices.  If remains borderline at next visit, will need to ADD second agent.  3. Hypercholesterolemia: stable; obtain labs; continue current medications. 4.  Screening prostate cancer: obtain PSA; pt declined DRE. 5. Screening DMII: obtain glucose, HgbA1c.   Meds ordered this encounter  Medications  . amLODipine  (NORVASC) 10 MG tablet    Sig: Take 1 tablet (10 mg total) by mouth daily.    Dispense:  30 tablet    Refill:  5    Return in about 6 months (around 07/13/2014) for recheck of blood pressure.    Reginia Forts, M.D.  [facility] [address] [place], [place]  [address] [phone] phone [phone] fax

## 2014-01-12 NOTE — Patient Instructions (Signed)

## 2014-01-12 NOTE — Progress Notes (Signed)
   Subjective:    Patient ID: Edward Vasquez, male    DOB: 1965-08-29, 48 y.o.   MRN: [id number]  HPI    Review of Systems  Constitutional: Negative.   HENT: Positive for sinus pressure.   Eyes: Negative.   Respiratory: Negative.   Cardiovascular: Negative.   Gastrointestinal: Negative.   Endocrine: Negative.   Genitourinary: Negative.   Musculoskeletal: Positive for arthralgias.  Skin: Negative.   Allergic/Immunologic: Positive for environmental allergies.  Neurological: Positive for numbness.  Hematological: Negative.   Psychiatric/Behavioral: Negative.        Objective:   Physical Exam        Assessment & Plan:

## 2014-01-13 LAB — PSA: PSA: 0.4 ng/mL (ref <=4.00)

## 2014-07-04 ENCOUNTER — Ambulatory Visit (INDEPENDENT_AMBULATORY_CARE_PROVIDER_SITE_OTHER): Payer: BLUE CROSS/BLUE SHIELD | Admitting: Family Medicine

## 2014-07-04 ENCOUNTER — Encounter: Payer: Self-pay | Admitting: Family Medicine

## 2014-07-04 VITALS — BP 136/84 | HR 69 | Temp 98.1°F | Resp 18 | Ht 64.0 in | Wt 237.0 lb

## 2014-07-04 DIAGNOSIS — M545 Low back pain, unspecified: Secondary | ICD-10-CM

## 2014-07-04 DIAGNOSIS — E785 Hyperlipidemia, unspecified: Secondary | ICD-10-CM | POA: Diagnosis not present

## 2014-07-04 DIAGNOSIS — I10 Essential (primary) hypertension: Secondary | ICD-10-CM | POA: Diagnosis not present

## 2014-07-04 DIAGNOSIS — Z7189 Other specified counseling: Secondary | ICD-10-CM | POA: Diagnosis not present

## 2014-07-04 DIAGNOSIS — M25561 Pain in right knee: Secondary | ICD-10-CM

## 2014-07-04 DIAGNOSIS — R7302 Impaired glucose tolerance (oral): Secondary | ICD-10-CM | POA: Diagnosis not present

## 2014-07-04 LAB — COMPREHENSIVE METABOLIC PANEL
ALBUMIN: 4.5 g/dL (ref 3.5–5.2)
ALK PHOS: 54 U/L (ref 39–117)
ALT: 27 U/L (ref 0–53)
AST: 18 U/L (ref 0–37)
BUN: 13 mg/dL (ref 6–23)
CALCIUM: 9.4 mg/dL (ref 8.4–10.5)
CHLORIDE: 107 meq/L (ref 96–112)
CO2: 26 mEq/L (ref 19–32)
CREATININE: 0.67 mg/dL (ref 0.50–1.35)
GLUCOSE: 93 mg/dL (ref 70–99)
POTASSIUM: 4.7 meq/L (ref 3.5–5.3)
Sodium: 140 mEq/L (ref 135–145)
Total Bilirubin: 0.6 mg/dL (ref 0.2–1.2)
Total Protein: 7.2 g/dL (ref 6.0–8.3)

## 2014-07-04 LAB — LIPID PANEL
CHOL/HDL RATIO: 4.2 ratio
CHOLESTEROL: 151 mg/dL (ref 0–200)
HDL: 36 mg/dL — AB (ref >=40)
LDL Cholesterol: 99 mg/dL (ref 0–99)
TRIGLYCERIDES: 78 mg/dL (ref <150)
VLDL: 16 mg/dL (ref 0–40)

## 2014-07-04 LAB — HEMOGLOBIN A1C
HEMOGLOBIN A1C: 5.9 % — AB (ref <5.7)
Mean Plasma Glucose: 123 mg/dL — ABNORMAL HIGH (ref <117)

## 2014-07-04 MED ORDER — AMLODIPINE BESYLATE 10 MG PO TABS: 10.0000 mg | ORAL_TABLET | Freq: Every day | ORAL | Status: DC

## 2014-07-04 NOTE — Progress Notes (Signed)
Subjective:    Patient ID: Edward Vasquez, male    DOB: 09/01/1965, 49 y.o.   MRN: [id number]  07/04/2014  Hypertension   HPI This 49 y.o. male presents for six month follow-up:  1. HTN:  Patient reports good compliance with medication, good tolerance to medication, and good symptom control.  Home BPs running 140/90.  Denies CP/palp/SOB/leg swelling.  2.  Glucose Intolerance: weight down since last visit; using Splenda in coffee and tea. B: skips; coffee no sugar with Splenda Snack: none Lunch:  Hamburger, fries, unsweet tea with Splenda Snack  None Supper:  [place] food, 7-10 tortillas.   Snack: cereal.    3.  Hyperlipidemia: weight down since last visit; working on weight loss.  4.  R lower back pain:  Having R lower back pain intermittently.  No radiation into legs; no n/t/w.  No saddle paresthesias; no b/b dysfunction.  Thinks secondary to chronic R knee pain.  5.  R knee pain: onset several years ago.  Xrays years ago.  No swelling; no giving out; no popping.  Pain below knee cap and around lateral aspect.  No injury; interested in ortho referral.     Review of Systems  Constitutional: Negative for fever, chills, diaphoresis, activity change, appetite change and fatigue.  Eyes: Negative for visual disturbance.  Respiratory: Negative for cough and shortness of breath.   Cardiovascular: Negative for chest pain, palpitations and leg swelling.  Endocrine: Negative for cold intolerance, heat intolerance, polydipsia, polyphagia and polyuria.  Musculoskeletal: Positive for back pain and arthralgias.  Neurological: Negative for dizziness, tremors, seizures, syncope, facial asymmetry, speech difficulty, weakness, light-headedness, numbness and headaches.    Past Medical History  Diagnosis Date  . Hypertension   . Hyperlipidemia   . Glucose intolerance (impaired glucose tolerance)    History reviewed. No pertinent past surgical history. No Known Allergies Current  Outpatient Prescriptions  Medication Sig Dispense Refill  . amLODipine (NORVASC) 10 MG tablet Take 1 tablet (10 mg total) by mouth daily. 30 tablet 5   No current facility-administered medications for this visit.       Objective:    BP 136/84 mmHg  Pulse 69  Temp(Src) 98.1 F (36.7 C) (Oral)  Resp 18  Ht  (1.626 m)  Wt 237 lb (107.502 kg)  BMI 40.66 kg/m2  SpO2 97% Physical Exam  Constitutional: He is oriented to person, place, and time. He appears well-developed and well-nourished. No distress.  HENT:  Head: Normocephalic and atraumatic.  Right Ear: External ear normal.  Left Ear: External ear normal.  Nose: Nose normal.  Mouth/Throat: Oropharynx is clear and moist.  Eyes: Conjunctivae and EOM are normal. Pupils are equal, round, and reactive to light.  Neck: Normal range of motion. Neck supple. Carotid bruit is not present. No thyromegaly present.  Cardiovascular: Normal rate, regular rhythm, normal heart sounds and intact distal pulses.  Exam reveals no gallop and no friction rub.   No murmur heard. Pulmonary/Chest: Effort normal and breath sounds normal. He has no wheezes. He has no rales.  Abdominal: Soft. Bowel sounds are normal. He exhibits no distension and no mass. There is no tenderness. There is no rebound and no guarding.  Musculoskeletal:       Right knee: Normal. He exhibits normal range of motion, no swelling, no effusion, no erythema, normal alignment and no bony tenderness. No tenderness found. No medial joint line, no lateral joint line, no MCL, no LCL and no patellar tendon tenderness noted.  Lumbar back: He exhibits pain. He exhibits normal range of motion, no tenderness, no bony tenderness and no spasm.  Lumbar spine:  Non-tender midline; non-tender paraspinal regions B.  Straight leg raises negative B; toe and heel walking intact; marching intact; motor 5/5 BLE.  Full ROM lumbar spine without limitation.   Lymphadenopathy:    He has no cervical  adenopathy.  Neurological: He is alert and oriented to person, place, and time. No cranial nerve deficit.  Skin: Skin is warm and dry. No rash noted. He is not diaphoretic.  Psychiatric: He has a normal mood and affect. His behavior is normal.  Nursing note and vitals reviewed.       Assessment & Plan:   1. Essential hypertension, benign   2. Hyperlipidemia   3. Glucose intolerance (impaired glucose tolerance)   4. Morbid obesity   5. Right knee pain   6. Essential hypertension   7. Encounter for medication review and counseling   8. Right-sided low back pain without sciatica     1. HTN: controlled; obtain labs; refills provided of Amlodipine. Pt desires six month trial of further weight loss. 2.  Hyperlipidemia: uncontrolled; repeat labs today; continue with dietary modification and weight loss. 3.  Glucose Intolerance: new.  Continue with weight loss and dietary modification. Obtain labs.  Decrease tortilla intake to less than 5 per meal. 4.  Morbid obesity: congratulations on weight loss; continue with dietary modification, weight loss attempts. 5.  R knee pain: chronic for five years; refer to ortho. 6.  R sided low back pain: New.  Home exercise program provided to perform daily; discuss with ortho if persists.    Meds ordered this encounter  Medications  . amLODipine (NORVASC) 10 MG tablet    Sig: Take 1 tablet (10 mg total) by mouth daily.    Dispense:  30 tablet    Refill:  5    Return in about 6 months (around 01/04/2015) for complete physical examiniation.    Kristi Paulita FujitaMartin Smith, M.D. [facility]  [place] [address] [place], [place]  [id number] [phone] phone [phone] fax

## 2014-07-04 NOTE — Patient Instructions (Signed)
Low Back Sprain with Rehab  A sprain is an injury in which a ligament is torn. The ligaments of the lower back are vulnerable to sprains. However, they are strong and require great force to be injured. These ligaments are important for stabilizing the spinal column. Sprains are classified into three categories. Grade 1 sprains cause pain, but the tendon is not lengthened. Grade 2 sprains include a lengthened ligament, due to the ligament being stretched or partially ruptured. With grade 2 sprains there is still function, although the function may be decreased. Grade 3 sprains involve a complete tear of the tendon or muscle, and function is usually impaired. SYMPTOMS   Severe pain in the lower back.  Sometimes, a feeling of a "pop," "snap," or tear, at the time of injury.  Tenderness and sometimes swelling at the injury site.  Uncommonly, bruising (contusion) within 48 hours of injury.  Muscle spasms in the back. CAUSES  Low back sprains occur when a force is placed on the ligaments that is greater than they can handle. Common causes of injury include:  Performing a stressful act while off-balance.  Repetitive stressful activities that involve movement of the lower back.  Direct hit (trauma) to the lower back. RISK INCREASES WITH:  Contact sports (football, wrestling).  Collisions (major skiing accidents).  Sports that require throwing or lifting (baseball, weightlifting).  Sports involving twisting of the spine (gymnastics, diving, tennis, golf).  Poor strength and flexibility.  Inadequate protection.  Previous back injury or surgery (especially fusion). PREVENTION  Wear properly fitted and padded protective equipment.  Warm up and stretch properly before activity.  Allow for adequate recovery between workouts.  Maintain physical fitness:  Strength, flexibility, and endurance.  Cardiovascular fitness.  Maintain a healthy body weight. PROGNOSIS  If treated  properly, low back sprains usually heal with non-surgical treatment. The length of time for healing depends on the severity of the injury.  RELATED COMPLICATIONS   Recurring symptoms, resulting in a chronic problem.  Chronic inflammation and pain in the low back.  Delayed healing or resolution of symptoms, especially if activity is resumed too soon.  Prolonged impairment.  Unstable or arthritic joints of the low back. TREATMENT  Treatment first involves the use of ice and medicine, to reduce pain and inflammation. The use of strengthening and stretching exercises may help reduce pain with activity. These exercises may be performed at home or with a therapist. Severe injuries may require referral to a therapist for further evaluation and treatment, such as ultrasound. Your caregiver may advise that you wear a back brace or corset, to help reduce pain and discomfort. Often, prolonged bed rest results in greater harm then benefit. Corticosteroid injections may be recommended. However, these should be reserved for the most serious cases. It is important to avoid using your back when lifting objects. At night, sleep on your back on a firm mattress, with a pillow placed under your knees. If non-surgical treatment is unsuccessful, surgery may be needed.  MEDICATION   If pain medicine is needed, nonsteroidal anti-inflammatory medicines (aspirin and ibuprofen), or other minor pain relievers (acetaminophen), are often advised.  Do not take pain medicine for 7 days before surgery.  Prescription pain relievers may be given, if your caregiver thinks they are needed. Use only as directed and only as much as you need.  Ointments applied to the skin may be helpful.  Corticosteroid injections may be given by your caregiver. These injections should be reserved for the most serious cases,   because they may only be given a certain number of times. HEAT AND COLD  Cold treatment (icing) should be applied for 10  to 15 minutes every 2 to 3 hours for inflammation and pain, and immediately after activity that aggravates your symptoms. Use ice packs or an ice massage.  Heat treatment may be used before performing stretching and strengthening activities prescribed by your caregiver, physical therapist, or athletic trainer. Use a heat pack or a warm water soak. SEEK MEDICAL CARE IF:   Symptoms get worse or do not improve in 2 to 4 weeks, despite treatment.  You develop numbness or weakness in either leg.  You lose bowel or bladder function.  Any of the following occur after surgery: fever, increased pain, swelling, redness, drainage of fluids, or bleeding in the affected area.  New, unexplained symptoms develop. (Drugs used in treatment may produce side effects.) EXERCISES  RANGE OF MOTION (ROM) AND STRETCHING EXERCISES - Low Back Sprain Most people with lower back pain will find that their symptoms get worse with excessive bending forward (flexion) or arching at the lower back (extension). The exercises that will help resolve your symptoms will focus on the opposite motion.  Your physician, physical therapist or athletic trainer will help you determine which exercises will be most helpful to resolve your lower back pain. Do not complete any exercises without first consulting with your caregiver. Discontinue any exercises which make your symptoms worse, until you speak to your caregiver. If you have pain, numbness or tingling which travels down into your buttocks, leg or foot, the goal of the therapy is for these symptoms to move closer to your back and eventually resolve. Sometimes, these leg symptoms will get better, but your lower back pain may worsen. This is often an indication of progress in your rehabilitation. Be very alert to any changes in your symptoms and the activities in which you participated in the 24 hours prior to the change. Sharing this information with your caregiver will allow him or her to  most efficiently treat your condition. These exercises may help you when beginning to rehabilitate your injury. Your symptoms may resolve with or without further involvement from your physician, physical therapist or athletic trainer. While completing these exercises, remember:   Restoring tissue flexibility helps normal motion to return to the joints. This allows healthier, less painful movement and activity.  An effective stretch should be held for at least 30 seconds.  A stretch should never be painful. You should only feel a gentle lengthening or release in the stretched tissue. FLEXION RANGE OF MOTION AND STRETCHING EXERCISES: STRETCH - Flexion, Single Knee to Chest   Lie on a firm bed or floor with both legs extended in front of you.  Keeping one leg in contact with the floor, bring your opposite knee to your chest. Hold your leg in place by either grabbing behind your thigh or at your knee.  Pull until you feel a gentle stretch in your low back. Hold __________ seconds.  Slowly release your grasp and repeat the exercise with the opposite side. Repeat __________ times. Complete this exercise __________ times per day.  STRETCH - Flexion, Double Knee to Chest  Lie on a firm bed or floor with both legs extended in front of you.  Keeping one leg in contact with the floor, bring your opposite knee to your chest.  Tense your stomach muscles to support your back and then lift your other knee to your chest. Hold your legs   in place by either grabbing behind your thighs or at your knees.  Pull both knees toward your chest until you feel a gentle stretch in your low back. Hold __________ seconds.  Tense your stomach muscles and slowly return one leg at a time to the floor. Repeat __________ times. Complete this exercise __________ times per day.  STRETCH - Low Trunk Rotation  Lie on a firm bed or floor. Keeping your legs in front of you, bend your knees so they are both pointed toward the  ceiling and your feet are flat on the floor.  Extend your arms out to the side. This will stabilize your upper body by keeping your shoulders in contact with the floor.  Gently and slowly drop both knees together to one side until you feel a gentle stretch in your low back. Hold for __________ seconds.  Tense your stomach muscles to support your lower back as you bring your knees back to the starting position. Repeat the exercise to the other side. Repeat __________ times. Complete this exercise __________ times per day  EXTENSION RANGE OF MOTION AND FLEXIBILITY EXERCISES: STRETCH - Extension, Prone on Elbows   Lie on your stomach on the floor, a bed will be too soft. Place your palms about shoulder width apart and at the height of your head.  Place your elbows under your shoulders. If this is too painful, stack pillows under your chest.  Allow your body to relax so that your hips drop lower and make contact more completely with the floor.  Hold this position for __________ seconds.  Slowly return to lying flat on the floor. Repeat __________ times. Complete this exercise __________ times per day.  RANGE OF MOTION - Extension, Prone Press Ups  Lie on your stomach on the floor, a bed will be too soft. Place your palms about shoulder width apart and at the height of your head.  Keeping your back as relaxed as possible, slowly straighten your elbows while keeping your hips on the floor. You may adjust the placement of your hands to maximize your comfort. As you gain motion, your hands will come more underneath your shoulders.  Hold this position __________ seconds.  Slowly return to lying flat on the floor. Repeat __________ times. Complete this exercise __________ times per day.  RANGE OF MOTION- Quadruped, Neutral Spine   Assume a hands and knees position on a firm surface. Keep your hands under your shoulders and your knees under your hips. You may place padding under your knees for  comfort.  Drop your head and point your tailbone toward the ground below you. This will round out your lower back like an angry cat. Hold this position for __________ seconds.  Slowly lift your head and release your tail bone so that your back sags into a large arch, like an old horse.  Hold this position for __________ seconds.  Repeat this until you feel limber in your low back.  Now, find your "sweet spot." This will be the most comfortable position somewhere between the two previous positions. This is your neutral spine. Once you have found this position, tense your stomach muscles to support your low back.  Hold this position for __________ seconds. Repeat __________ times. Complete this exercise __________ times per day.  STRENGTHENING EXERCISES - Low Back Sprain These exercises may help you when beginning to rehabilitate your injury. These exercises should be done near your "sweet spot." This is the neutral, low-back arch, somewhere between fully rounded   and fully arched, that is your least painful position. When performed in this safe range of motion, these exercises can be used for people who have either a flexion or extension based injury. These exercises may resolve your symptoms with or without further involvement from your physician, physical therapist or athletic trainer. While completing these exercises, remember:   Muscles can gain both the endurance and the strength needed for everyday activities through controlled exercises.  Complete these exercises as instructed by your physician, physical therapist or athletic trainer. Increase the resistance and repetitions only as guided.  You may experience muscle soreness or fatigue, but the pain or discomfort you are trying to eliminate should never worsen during these exercises. If this pain does worsen, stop and make certain you are following the directions exactly. If the pain is still present after adjustments, discontinue the  exercise until you can discuss the trouble with your caregiver. STRENGTHENING - Deep Abdominals, Pelvic Tilt   Lie on a firm bed or floor. Keeping your legs in front of you, bend your knees so they are both pointed toward the ceiling and your feet are flat on the floor.  Tense your lower abdominal muscles to press your low back into the floor. This motion will rotate your pelvis so that your tail bone is scooping upwards rather than pointing at your feet or into the floor. With a gentle tension and even breathing, hold this position for __________ seconds. Repeat __________ times. Complete this exercise __________ times per day.  STRENGTHENING - Abdominals, Crunches   Lie on a firm bed or floor. Keeping your legs in front of you, bend your knees so they are both pointed toward the ceiling and your feet are flat on the floor. Cross your arms over your chest.  Slightly tip your chin down without bending your neck.  Tense your abdominals and slowly lift your trunk high enough to just clear your shoulder blades. Lifting higher can put excessive stress on the lower back and does not further strengthen your abdominal muscles.  Control your return to the starting position. Repeat __________ times. Complete this exercise __________ times per day.  STRENGTHENING - Quadruped, Opposite UE/LE Lift   Assume a hands and knees position on a firm surface. Keep your hands under your shoulders and your knees under your hips. You may place padding under your knees for comfort.  Find your neutral spine and gently tense your abdominal muscles so that you can maintain this position. Your shoulders and hips should form a rectangle that is parallel with the floor and is not twisted.  Keeping your trunk steady, lift your right hand no higher than your shoulder and then your left leg no higher than your hip. Make sure you are not holding your breath. Hold this position for __________ seconds.  Continuing to keep  your abdominal muscles tense and your back steady, slowly return to your starting position. Repeat with the opposite arm and leg. Repeat __________ times. Complete this exercise __________ times per day.  STRENGTHENING - Abdominals and Quadriceps, Straight Leg Raise   Lie on a firm bed or floor with both legs extended in front of you.  Keeping one leg in contact with the floor, bend the other knee so that your foot can rest flat on the floor.  Find your neutral spine, and tense your abdominal muscles to maintain your spinal position throughout the exercise.  Slowly lift your straight leg off the floor about 6 inches for a count   of 15, making sure to not hold your breath.  Still keeping your neutral spine, slowly lower your leg all the way to the floor. Repeat this exercise with each leg __________ times. Complete this exercise __________ times per day. POSTURE AND BODY MECHANICS CONSIDERATIONS - Low Back Sprain Keeping correct posture when sitting, standing or completing your activities will reduce the stress put on different body tissues, allowing injured tissues a chance to heal and limiting painful experiences. The following are general guidelines for improved posture. Your physician or physical therapist will provide you with any instructions specific to your needs. While reading these guidelines, remember:  The exercises prescribed by your provider will help you have the flexibility and strength to maintain correct postures.  The correct posture provides the best environment for your joints to work. All of your joints have less wear and tear when properly supported by a spine with good posture. This means you will experience a healthier, less painful body.  Correct posture must be practiced with all of your activities, especially prolonged sitting and standing. Correct posture is as important when doing repetitive low-stress activities (typing) as it is when doing a single heavy-load  activity (lifting). RESTING POSITIONS Consider which positions are most painful for you when choosing a resting position. If you have pain with flexion-based activities (sitting, bending, stooping, squatting), choose a position that allows you to rest in a less flexed posture. You would want to avoid curling into a fetal position on your side. If your pain worsens with extension-based activities (prolonged standing, working overhead), avoid resting in an extended position such as sleeping on your stomach. Most people will find more comfort when they rest with their spine in a more neutral position, neither too rounded nor too arched. Lying on a non-sagging bed on your side with a pillow between your knees, or on your back with a pillow under your knees will often provide some relief. Keep in mind, being in any one position for a prolonged period of time, no matter how correct your posture, can still lead to stiffness. PROPER SITTING POSTURE In order to minimize stress and discomfort on your spine, you must sit with correct posture. Sitting with good posture should be effortless for a healthy body. Returning to good posture is a gradual process. Many people can work toward this most comfortably by using various supports until they have the flexibility and strength to maintain this posture on their own. When sitting with proper posture, your ears will fall over your shoulders and your shoulders will fall over your hips. You should use the back of the chair to support your upper back. Your lower back will be in a neutral position, just slightly arched. You may place a small pillow or folded towel at the base of your lower back for  support.  When working at a desk, create an environment that supports good, upright posture. Without extra support, muscles tire, which leads to excessive strain on joints and other tissues. Keep these recommendations in mind: CHAIR:  A chair should be able to slide under your desk  when your back makes contact with the back of the chair. This allows you to work closely.  The chair's height should allow your eyes to be level with the upper part of your monitor and your hands to be slightly lower than your elbows. BODY POSITION  Your feet should make contact with the floor. If this is not possible, use a foot rest.  Keep your   ears over your shoulders. This will reduce stress on your neck and low back. INCORRECT SITTING POSTURES  If you are feeling tired and unable to assume a healthy sitting posture, do not slouch or slump. This puts excessive strain on your back tissues, causing more damage and pain. Healthier options include:  Using more support, like a lumbar pillow.  Switching tasks to something that requires you to be upright or walking.  Talking a brief walk.  Lying down to rest in a neutral-spine position. PROLONGED STANDING WHILE SLIGHTLY LEANING FORWARD  When completing a task that requires you to lean forward while standing in one place for a long time, place either foot up on a stationary 2-4 inch high object to help maintain the best posture. When both feet are on the ground, the lower back tends to lose its slight inward curve. If this curve flattens (or becomes too large), then the back and your other joints will experience too much stress, tire more quickly, and can cause pain. CORRECT STANDING POSTURES Proper standing posture should be assumed with all daily activities, even if they only take a few moments, like when brushing your teeth. As in sitting, your ears should fall over your shoulders and your shoulders should fall over your hips. You should keep a slight tension in your abdominal muscles to brace your spine. Your tailbone should point down to the ground, not behind your body, resulting in an over-extended swayback posture.  INCORRECT STANDING POSTURES  Common incorrect standing postures include a forward head, locked knees and/or an excessive  swayback. WALKING Walk with an upright posture. Your ears, shoulders and hips should all line-up. PROLONGED ACTIVITY IN A FLEXED POSITION When completing a task that requires you to bend forward at your waist or lean over a low surface, try to find a way to stabilize 3 out of 4 of your limbs. You can place a hand or elbow on your thigh or rest a knee on the surface you are reaching across. This will provide you more stability, so that your muscles do not tire as quickly. By keeping your knees relaxed, or slightly bent, you will also reduce stress across your lower back. CORRECT LIFTING TECHNIQUES DO :  Assume a wide stance. This will provide you more stability and the opportunity to get as close as possible to the object which you are lifting.  Tense your abdominals to brace your spine. Bend at the knees and hips. Keeping your back locked in a neutral-spine position, lift using your leg muscles. Lift with your legs, keeping your back straight.  Test the weight of unknown objects before attempting to lift them.  Try to keep your elbows locked down at your sides in order get the best strength from your shoulders when carrying an object.  Always ask for help when lifting heavy or awkward objects. INCORRECT LIFTING TECHNIQUES DO NOT:   Lock your knees when lifting, even if it is a small object.  Bend and twist. Pivot at your feet or move your feet when needing to change directions.  Assume that you can safely pick up even a paperclip without proper posture. Document Released: 04/01/2005 Document Revised: 06/24/2011 Document Reviewed: 07/14/2008 [employer] Patient Information 2015 [employer]. This information is not intended to replace advice given to you by your health care provider. Make sure you discuss any questions you have with your health care provider.  

## 2015-01-01 ENCOUNTER — Ambulatory Visit (INDEPENDENT_AMBULATORY_CARE_PROVIDER_SITE_OTHER): Payer: BLUE CROSS/BLUE SHIELD | Admitting: Physician Assistant

## 2015-01-01 VITALS — BP 128/78 | HR 64 | Temp 98.4°F | Resp 18 | Ht 64.5 in | Wt 238.0 lb

## 2015-01-01 DIAGNOSIS — H18892 Other specified disorders of cornea, left eye: Secondary | ICD-10-CM | POA: Diagnosis not present

## 2015-01-01 MED ORDER — POLYETHYL GLYCOL-PROPYL GLYCOL 0.4-0.3 % OP GEL: 1.0000 "application " | Freq: Three times a day (TID) | OPHTHALMIC | Status: DC

## 2015-01-01 MED ORDER — POLYMYXIN B-TRIMETHOPRIM 10000-0.1 UNIT/ML-% OP SOLN: 1.0000 [drp] | OPHTHALMIC | Status: DC

## 2015-01-01 NOTE — Progress Notes (Signed)
01/01/2015 at 9:09 AM  Edward Vasquez / DOB: 11-21-1965 / MRN: [id number]  The patient has HEMORRHOIDS-EXTERNAL; ENTEROCOLITIS; and HTN (hypertension) on his problem list.  SUBJECTIVE  Edward Vasquez is a 49 y.o. 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  He reports some metal fell in his eye while he was looking up.  He was able to wash this out the next day however he continues to have some mild eye irritation.  Denies contact lens use, nausea and photophobia.  Denies changes in vision. He feels that his symptoms are continuing to improve.         He  has a past medical history of Hypertension; Hyperlipidemia; and Glucose intolerance (impaired glucose tolerance).    Medications reviewed and updated by myself where necessary, and exist elsewhere in the encounter.   [name] has No Known Allergies. He  reports that he has never smoked. He does not have any smokeless tobacco history on file. He reports that he does not drink alcohol or use illicit drugs. He  reports that he currently engages in sexual activity. He reports using the following method of birth control/protection: None. The patient  has no past surgical history on file.  His family history includes Diabetes in his mother; Hypertension in his father and mother; Mental illness in his sister; Stroke in his brother.  Review of Systems  Constitutional: Negative for fever and chills.  Respiratory: Negative for shortness of breath.   Cardiovascular: Negative for chest pain.  Gastrointestinal: Negative for nausea and abdominal pain.  Genitourinary: Negative.   Skin: Negative for rash.  Neurological: Negative for dizziness and headaches.    OBJECTIVE  His  height is 5' 4.5" (1.638 m) and weight is 238 lb (107.956 kg). His oral temperature is 98.4 F (36.9 C). His blood pressure is 128/78 and his pulse is 64. His respiration is 18 and  oxygen saturation is 98%.  The patient's body mass index is 40.24 kg/(m^2).  Physical Exam  Vitals reviewed. Constitutional: He is oriented to person, place, and time. He appears well-developed. No distress.  Eyes: EOM and lids are normal. Pupils are equal, round, and reactive to light. Lids are everted and swept, no foreign bodies found. Right eye exhibits no chemosis, no discharge, no exudate and no hordeolum. No foreign body present in the right eye. Left eye exhibits no chemosis, no discharge, no exudate and no hordeolum. No foreign body present in the left eye. Right conjunctiva is not injected. Right conjunctiva has no hemorrhage. Left conjunctiva is injected. Left conjunctiva has no hemorrhage. No scleral icterus.  Slit lamp exam:      The left eye shows no corneal abrasion, no corneal flare, no corneal ulcer, no foreign body and no fluorescein uptake.    Neck: Normal range of motion.  Cardiovascular: Normal rate.   Respiratory: Effort normal.  GI: He exhibits no distension.  Musculoskeletal: Normal range of motion.  Neurological: He is alert and oriented to person, place, and time. No cranial nerve deficit.  Skin: Skin is warm and dry. No rash noted. He is not diaphoretic.  Psychiatric: He has a normal mood and affect.   Slit lamp exam: Complete resolution of pain with anesthetic drops.  Negative for ulceration, rust ring, and abrasion.   Vision 20/30 bilaterally.    No results found for this or any previous visit (from the past 24 hour(s)).  ASSESSMENT & PLAN  Aspen was seen today for eye problem.  Diagnoses and all orders for this visit:  Corneal irritation of left eye: Patient with reassuring HPI and physical exam.  He is to try the below plan for three days and if he does not continue to improve will send to opthalmology.  -     trimethoprim-polymyxin b (POLYTRIM) ophthalmic solution; Place 1 drop into the left eye every 4 (four) hours. -     Polyethyl Glycol-Propyl Glycol  (SYSTANE) 0.4-0.3 % GEL ophthalmic gel; Place 1 application into both eyes 3 (three) times daily.   The patient was advised to call or come back to clinic if he does not see an improvement in symptoms, or worsens with the above plan.   Edward Vasquez, MHS, PA-C [facility] 01/01/2015 9:09 AM

## 2015-01-16 ENCOUNTER — Ambulatory Visit (INDEPENDENT_AMBULATORY_CARE_PROVIDER_SITE_OTHER): Payer: BLUE CROSS/BLUE SHIELD | Admitting: Family Medicine

## 2015-01-16 ENCOUNTER — Encounter: Payer: Self-pay | Admitting: Family Medicine

## 2015-01-16 VITALS — BP 130/83 | HR 70 | Temp 98.7°F | Resp 16 | Ht 63.5 in | Wt 238.8 lb

## 2015-01-16 DIAGNOSIS — Z7189 Other specified counseling: Secondary | ICD-10-CM

## 2015-01-16 DIAGNOSIS — H578 Other specified disorders of eye and adnexa: Secondary | ICD-10-CM

## 2015-01-16 DIAGNOSIS — Z23 Encounter for immunization: Secondary | ICD-10-CM | POA: Diagnosis not present

## 2015-01-16 DIAGNOSIS — M25561 Pain in right knee: Secondary | ICD-10-CM

## 2015-01-16 DIAGNOSIS — I10 Essential (primary) hypertension: Secondary | ICD-10-CM

## 2015-01-16 DIAGNOSIS — H5789 Other specified disorders of eye and adnexa: Secondary | ICD-10-CM

## 2015-01-16 DIAGNOSIS — Z13220 Encounter for screening for lipoid disorders: Secondary | ICD-10-CM | POA: Diagnosis not present

## 2015-01-16 DIAGNOSIS — Z131 Encounter for screening for diabetes mellitus: Secondary | ICD-10-CM | POA: Diagnosis not present

## 2015-01-16 DIAGNOSIS — Z114 Encounter for screening for human immunodeficiency virus [HIV]: Secondary | ICD-10-CM | POA: Diagnosis not present

## 2015-01-16 DIAGNOSIS — Z6841 Body Mass Index (BMI) 40.0 and over, adult: Secondary | ICD-10-CM

## 2015-01-16 DIAGNOSIS — Z0000 Encounter for general adult medical examination without abnormal findings: Secondary | ICD-10-CM | POA: Diagnosis not present

## 2015-01-16 DIAGNOSIS — E669 Obesity, unspecified: Secondary | ICD-10-CM

## 2015-01-16 LAB — CBC WITH DIFFERENTIAL/PLATELET
BASOS PCT: 1 % (ref 0–1)
Basophils Absolute: 0.1 10*3/uL (ref 0.0–0.1)
Eosinophils Absolute: 0.2 10*3/uL (ref 0.0–0.7)
Eosinophils Relative: 3 % (ref 0–5)
HCT: 44.6 % (ref 39.0–52.0)
HEMOGLOBIN: 15.5 g/dL (ref 13.0–17.0)
Lymphocytes Relative: 34 % (ref 12–46)
Lymphs Abs: 2.1 10*3/uL (ref 0.7–4.0)
MCH: 31.4 pg (ref 26.0–34.0)
MCHC: 34.8 g/dL (ref 30.0–36.0)
MCV: 90.5 fL (ref 78.0–100.0)
MONO ABS: 0.5 10*3/uL (ref 0.1–1.0)
MONOS PCT: 8 % (ref 3–12)
MPV: 10.1 fL (ref 8.6–12.4)
NEUTROS ABS: 3.3 10*3/uL (ref 1.7–7.7)
Neutrophils Relative %: 54 % (ref 43–77)
Platelets: 349 10*3/uL (ref 150–400)
RBC: 4.93 MIL/uL (ref 4.22–5.81)
RDW: 14.2 % (ref 11.5–15.5)
WBC: 6.1 10*3/uL (ref 4.0–10.5)

## 2015-01-16 LAB — COMPREHENSIVE METABOLIC PANEL
ALBUMIN: 4.5 g/dL (ref 3.6–5.1)
ALT: 26 U/L (ref 9–46)
AST: 17 U/L (ref 10–40)
Alkaline Phosphatase: 54 U/L (ref 40–115)
BUN: 12 mg/dL (ref 7–25)
CALCIUM: 9.5 mg/dL (ref 8.6–10.3)
CHLORIDE: 105 mmol/L (ref 98–110)
CO2: 27 mmol/L (ref 20–31)
CREATININE: 0.73 mg/dL (ref 0.60–1.35)
Glucose, Bld: 93 mg/dL (ref 65–99)
POTASSIUM: 4.3 mmol/L (ref 3.5–5.3)
SODIUM: 137 mmol/L (ref 135–146)
Total Bilirubin: 0.6 mg/dL (ref 0.2–1.2)
Total Protein: 7.4 g/dL (ref 6.1–8.1)

## 2015-01-16 LAB — LIPID PANEL
CHOL/HDL RATIO: 5.6 ratio — AB (ref <=5.0)
CHOLESTEROL: 207 mg/dL — AB (ref 125–200)
HDL: 37 mg/dL — AB (ref >=40)
LDL Cholesterol: 155 mg/dL — ABNORMAL HIGH (ref <130)
TRIGLYCERIDES: 73 mg/dL (ref <150)
VLDL: 15 mg/dL (ref <30)

## 2015-01-16 LAB — POCT URINALYSIS DIP (MANUAL ENTRY)
BILIRUBIN UA: NEGATIVE
Bilirubin, UA: NEGATIVE
Glucose, UA: NEGATIVE
Leukocytes, UA: NEGATIVE
Nitrite, UA: NEGATIVE
PH UA: 7
Protein Ur, POC: NEGATIVE
SPEC GRAV UA: 1.02
Urobilinogen, UA: 0.2

## 2015-01-16 LAB — HIV ANTIBODY (ROUTINE TESTING W REFLEX): HIV: NONREACTIVE

## 2015-01-16 LAB — TSH: TSH: 3.244 u[IU]/mL (ref 0.350–4.500)

## 2015-01-16 LAB — HEMOGLOBIN A1C
Hgb A1c MFr Bld: 5.8 % — ABNORMAL HIGH (ref <5.7)
MEAN PLASMA GLUCOSE: 120 mg/dL — AB (ref <117)

## 2015-01-16 MED ORDER — AMLODIPINE BESYLATE 10 MG PO TABS: 10.0000 mg | ORAL_TABLET | Freq: Every day | ORAL | Status: DC

## 2015-01-16 NOTE — Progress Notes (Signed)
Subjective:    Patient ID: Edward Vasquez, male    DOB: 16-Jan-1966, 49 y.o.   MRN: [id number]  01/16/2015  Annual Exam and Medication Refill   HPI This 49 y.o. male presents for Complete Physical Examination.  Last physical:  01-12-2014 Colonoscopy: 2011 [facility] TDAP:  2006; agreeable today. Influenza:  Refuses; may get at work this year. Eye exam:   No glasses or contacts. Dental exam:  never  L eye irritation: got foreign body in eye three weeks ago. Flushed eye with removal of aluminum FB.  Presented to office two weeks ago; no persistent FB found. Still having medial eye irritation; no blurred vision; no photophobia. No tearing.  Also having L periorbital headache.  Requesting referral to eye doctor.  R knee pain: persistent; unable to schedule orthopedic appointment after last visit; requesting referral to ortho again.  Pain with pressure on R knee. No giving out; no swelling.  No locking.  Review of Systems  Constitutional: Negative for fever, chills, diaphoresis, activity change, appetite change, fatigue and unexpected weight change.  HENT: Negative for congestion, dental problem, drooling, ear discharge, ear pain, facial swelling, hearing loss, mouth sores, nosebleeds, postnasal drip, rhinorrhea, sinus pressure, sneezing, sore throat, tinnitus, trouble swallowing and voice change.   Eyes: Positive for pain. Negative for photophobia, discharge, redness, itching and visual disturbance.  Respiratory: Negative for apnea, cough, choking, chest tightness, shortness of breath, wheezing and stridor.   Cardiovascular: Negative for chest pain, palpitations and leg swelling.  Gastrointestinal: Negative for nausea, vomiting, abdominal pain, diarrhea, constipation and blood in stool.  Endocrine: Negative for cold intolerance, heat intolerance, polydipsia, polyphagia and polyuria.  Genitourinary: Negative for dysuria, urgency, frequency, hematuria, flank pain, decreased urine volume,  discharge, penile swelling, scrotal swelling, enuresis, difficulty urinating, genital sores, penile pain and testicular pain.  Musculoskeletal: Positive for arthralgias. Negative for myalgias, back pain, joint swelling, gait problem, neck pain and neck stiffness.  Skin: Negative for color change, pallor, rash and wound.  Allergic/Immunologic: Negative for environmental allergies, food allergies and immunocompromised state.  Neurological: Negative for dizziness, tremors, seizures, syncope, facial asymmetry, speech difficulty, weakness, light-headedness, numbness and headaches.  Hematological: Negative for adenopathy. Does not bruise/bleed easily.  Psychiatric/Behavioral: Negative for suicidal ideas, hallucinations, behavioral problems, confusion, sleep disturbance, self-injury, dysphoric mood, decreased concentration and agitation. The patient is not nervous/anxious and is not hyperactive.     Past Medical History  Diagnosis Date  . Hypertension   . Hyperlipidemia   . Glucose intolerance (impaired glucose tolerance)    History reviewed. No pertinent past surgical history. No Known Allergies Current Outpatient Prescriptions  Medication Sig Dispense Refill  . amLODipine (NORVASC) 10 MG tablet Take 1 tablet (10 mg total) by mouth daily. 30 tablet 5   No current facility-administered medications for this visit.   Social History   Social History  . Marital Status: Married    Spouse Name: N/A  . Number of Children: N/A  . Years of Education: N/A   Occupational History  . Not on file.   Social History Main Topics  . Smoking status: Never Smoker   . Smokeless tobacco: Not on file  . Alcohol Use: 0.0 oz/week    0 Standard drinks or equivalent per week     Comment: RARE BEER  . Drug Use: No  . Sexual Activity: Yes    Birth Control/ Protection: None   Other Topics Concern  . Not on file   Social History Narrative   Marital status:  married x 20 years; from [place]; [place] since 1989.        Children:  2 children (19, 15); no grandchildren.      Lives: with wife, 2 children.       Employment: Holiday representative work since age 3.        Tobacco: none      Alcohol: rare; socially      Drugs: none      Exercise: work is physically demanding.      Seatbelt:  100%      Guns:  none   Family History  Problem Relation Age of Onset  . Diabetes Mother   . Hypertension Mother   . Hypertension Father   . Mental illness Sister   . Stroke Brother   . Hypertension Brother   . Hypertension Sister        Objective:    BP 130/83 mmHg  Pulse 70  Temp(Src) 98.7 F (37.1 C) (Oral)  Resp 16  Ht 5' 3.5" (1.613 m)  Wt 238 lb 12.8 oz (108.319 kg)  BMI 41.63 kg/m2 Physical Exam  Constitutional: He is oriented to person, place, and time. He appears well-developed and well-nourished. No distress.  Obese  HENT:  Head: Normocephalic and atraumatic.  Right Ear: External ear normal.  Left Ear: External ear normal.  Nose: Nose normal.  Mouth/Throat: Oropharynx is clear and moist.  Eyes: Conjunctivae and EOM are normal. Pupils are equal, round, and reactive to light. Right eye exhibits no discharge. Left eye exhibits no discharge.  Neck: Normal range of motion. Neck supple. Carotid bruit is not present. No thyromegaly present.  Cardiovascular: Normal rate, regular rhythm, normal heart sounds and intact distal pulses.  Exam reveals no gallop and no friction rub.   No murmur heard. Pulmonary/Chest: Effort normal and breath sounds normal. He has no wheezes. He has no rales.  Abdominal: Soft. Bowel sounds are normal. He exhibits no distension and no mass. There is no tenderness. There is no rebound and no guarding. Hernia confirmed negative in the right inguinal area and confirmed negative in the left inguinal area.  Genitourinary: Testes normal and penis normal. Right testis shows no mass, no swelling and no tenderness. Left testis shows no mass, no swelling and no tenderness. Circumcised.   Musculoskeletal:       Right shoulder: Normal.       Left shoulder: Normal.       Right knee: He exhibits normal range of motion and no swelling.       Cervical back: Normal.  Lymphadenopathy:    He has no cervical adenopathy.       Right: No inguinal adenopathy present.       Left: No inguinal adenopathy present.  Neurological: He is alert and oriented to person, place, and time. He has normal reflexes. No cranial nerve deficit. He exhibits normal muscle tone. Coordination normal.  Skin: Skin is warm and dry. No rash noted. He is not diaphoretic.  Psychiatric: He has a normal mood and affect. His behavior is normal. Judgment and thought content normal.   Wt Readings from Last 3 Encounters:  01/16/15 238 lb 12.8 oz (108.319 kg)  01/01/15 238 lb (107.956 kg)  07/04/14 237 lb (107.502 kg)    TDAP ADMINISTERED.    Assessment & Plan:   1. Routine physical examination   2. Essential hypertension   3. Screening for diabetes mellitus   4. Screening, lipid   5. Irritation of left eye   6. Recurrent  knee pain, right   7. Encounter for medication review and counseling   8. Need for Tdap vaccination   9. Screening for HIV (human immunodeficiency virus)   10. Obesity   11. BMI 40.0-44.9, adult (HCC)     1. Complete Physical Examination: anticipatory guidance --- weight loss.  S/p TDAP in office; pt refused flu vaccine.   2. HTN: controlled; obtain labs; refill provided; rTC six months. 3.  Screening DMII/glucose intolerance: obtain glucose, HgbA1c; recommend weight loss, exercise, low-sugar and low-carbohydrate food choices. 4.  Screening lipid/hypercholesterolemia: obtain FLP. 5.  L eye irritation: persistent; refer to ophthalmology. 6.  Persistent R knee pain: persistent; refer to ortho. 7.  Screening HIV: obtain HIV per CDC guidelines. 8.  S/p TDAP 9.  Obesity/BMI 44: highly recommend weight loss, 1800kcal food restriction daily, exercise.   Orders Placed This Encounter   Procedures  . Tdap vaccine greater than or equal to 7yo IM  . CBC with Differential/Platelet  . Comprehensive metabolic panel    Order Specific Question:  Has the patient fasted?    Answer:  Yes  . Hemoglobin A1c  . Lipid panel    Order Specific Question:  Has the patient fasted?    Answer:  Yes  . TSH  . HIV antibody  . Ambulatory referral to Ophthalmology    Referral Priority:  Routine    Referral Type:  Consultation    Referral Reason:  Specialty Services Required    Requested Specialty:  Ophthalmology    Number of Visits Requested:  1  . Ambulatory referral to Orthopedic Surgery    Referral Priority:  Routine    Referral Type:  Surgical    Referral Reason:  Specialty Services Required    Requested Specialty:  Orthopedic Surgery    Number of Visits Requested:  1  . POCT urinalysis dipstick   Meds ordered this encounter  Medications  . amLODipine (NORVASC) 10 MG tablet    Sig: Take 1 tablet (10 mg total) by mouth daily.    Dispense:  30 tablet    Refill:  5    Return in about 6 months (around 07/17/2015) for recheck high blood pressure.    Harvie Morua Paulita Fujita, M.D. [facility] [address] [place], [place]  [address] [phone] phone [phone] fax

## 2015-01-16 NOTE — Patient Instructions (Signed)

## 2015-01-17 NOTE — Progress Notes (Signed)
  Medical screening examination/treatment/procedure(s) were performed by non-physician practitioner and as supervising physician I was immediately available for consultation/collaboration.     

## 2015-07-17 ENCOUNTER — Ambulatory Visit: Payer: Self-pay | Admitting: Family Medicine

## 2015-07-24 ENCOUNTER — Other Ambulatory Visit: Payer: Self-pay | Admitting: Family Medicine

## 2015-08-01 ENCOUNTER — Ambulatory Visit (INDEPENDENT_AMBULATORY_CARE_PROVIDER_SITE_OTHER): Payer: BLUE CROSS/BLUE SHIELD | Admitting: Family Medicine

## 2015-08-01 ENCOUNTER — Encounter: Payer: Self-pay | Admitting: Family Medicine

## 2015-08-01 VITALS — BP 126/86 | HR 69 | Temp 98.4°F | Resp 16 | Ht 63.5 in | Wt 240.0 lb

## 2015-08-01 DIAGNOSIS — E669 Obesity, unspecified: Secondary | ICD-10-CM

## 2015-08-01 DIAGNOSIS — E785 Hyperlipidemia, unspecified: Secondary | ICD-10-CM | POA: Insufficient documentation

## 2015-08-01 DIAGNOSIS — Z6841 Body Mass Index (BMI) 40.0 and over, adult: Secondary | ICD-10-CM | POA: Diagnosis not present

## 2015-08-01 DIAGNOSIS — E7800 Pure hypercholesterolemia, unspecified: Secondary | ICD-10-CM | POA: Diagnosis not present

## 2015-08-01 DIAGNOSIS — R7302 Impaired glucose tolerance (oral): Secondary | ICD-10-CM

## 2015-08-01 DIAGNOSIS — I10 Essential (primary) hypertension: Secondary | ICD-10-CM | POA: Diagnosis not present

## 2015-08-01 DIAGNOSIS — R7303 Prediabetes: Secondary | ICD-10-CM | POA: Insufficient documentation

## 2015-08-01 LAB — CBC WITH DIFFERENTIAL/PLATELET
BASOS ABS: 71 {cells}/uL (ref 0–200)
Basophils Relative: 1 %
EOS ABS: 142 {cells}/uL (ref 15–500)
Eosinophils Relative: 2 %
HCT: 44.5 % (ref 38.5–50.0)
HEMOGLOBIN: 15.5 g/dL (ref 13.2–17.1)
LYMPHS ABS: 1988 {cells}/uL (ref 850–3900)
Lymphocytes Relative: 28 %
MCH: 30.8 pg (ref 27.0–33.0)
MCHC: 34.8 g/dL (ref 32.0–36.0)
MCV: 88.5 fL (ref 80.0–100.0)
MPV: 10 fL (ref 7.5–12.5)
Monocytes Absolute: 639 cells/uL (ref 200–950)
Monocytes Relative: 9 %
NEUTROS ABS: 4260 {cells}/uL (ref 1500–7800)
NEUTROS PCT: 60 %
Platelets: 352 10*3/uL (ref 140–400)
RBC: 5.03 MIL/uL (ref 4.20–5.80)
RDW: 14.5 % (ref 11.0–15.0)
WBC: 7.1 10*3/uL (ref 3.8–10.8)

## 2015-08-01 LAB — COMPREHENSIVE METABOLIC PANEL
ALBUMIN: 4.8 g/dL (ref 3.6–5.1)
ALT: 32 U/L (ref 9–46)
AST: 22 U/L (ref 10–40)
Alkaline Phosphatase: 59 U/L (ref 40–115)
BUN: 10 mg/dL (ref 7–25)
CALCIUM: 9.2 mg/dL (ref 8.6–10.3)
CHLORIDE: 105 mmol/L (ref 98–110)
CO2: 25 mmol/L (ref 20–31)
Creat: 0.6 mg/dL (ref 0.60–1.35)
Glucose, Bld: 94 mg/dL (ref 65–99)
POTASSIUM: 4 mmol/L (ref 3.5–5.3)
Sodium: 139 mmol/L (ref 135–146)
TOTAL PROTEIN: 7.5 g/dL (ref 6.1–8.1)
Total Bilirubin: 0.9 mg/dL (ref 0.2–1.2)

## 2015-08-01 LAB — LIPID PANEL
CHOLESTEROL: 189 mg/dL (ref 125–200)
HDL: 34 mg/dL — AB (ref >=40)
LDL Cholesterol: 137 mg/dL — ABNORMAL HIGH (ref <130)
TRIGLYCERIDES: 88 mg/dL (ref <150)
Total CHOL/HDL Ratio: 5.6 Ratio — ABNORMAL HIGH (ref <=5.0)
VLDL: 18 mg/dL (ref <30)

## 2015-08-01 LAB — HEMOGLOBIN A1C
HEMOGLOBIN A1C: 5.9 % — AB (ref <5.7)
MEAN PLASMA GLUCOSE: 123 mg/dL

## 2015-08-01 MED ORDER — AMLODIPINE BESYLATE 10 MG PO TABS: 10.0000 mg | ORAL_TABLET | Freq: Every day | ORAL | Status: DC

## 2015-08-01 NOTE — Patient Instructions (Addendum)
1.  STOP regular soda and sweet tea. It is fine to drink diet soda and unsweetened tea. 2.  Eat only 5 tortillas with each meal.     IF you received an x-ray today, you will receive an invoice from [facility]. Please contact [facility] at [phone] with questions or concerns regarding your invoice.   IF you received labwork today, you will receive an invoice from [facility]. Please contact [facility] at [phone] with questions or concerns regarding your invoice.   Our billing staff will not be able to assist you with questions regarding bills from these companies.  You will be contacted with the lab results as soon as they are available. The fastest way to get your results is to activate your My Chart account. Instructions are located on the last page of this paperwork. If you have not heard from us regarding the results in 2 weeks, please contact this office.

## 2015-08-01 NOTE — Progress Notes (Signed)
Subjective:    Patient ID: Edward Vasquez, male    DOB: 06-29-1965, 50 y.o.   MRN: [id number]  08/01/2015  Follow-up and Medication Refill   HPI This 50 y.o. male presents for six month follow-up:  1. HTN: Patient reports good compliance with medication, good tolerance to medication, and good symptom control.   Home BP running 130-145/80-95.    2. Glucose intolerance:  Has not lost weight since last visit.   B; skips; coffee Lunch: hamburger, french fries, sweet tea Supper: vegetables, rice, 10 tortillas, sweet tea  3.  Hyperlipidemia:  Fasting today; LDL elevated at last visit; was fasting at last visit.  4.  R knee pain: referred to ortho after last visit; doing 50% improved.  s/p injection; much better.    5.  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  Had episode of acute SOB while sitting on couch one evening; denies associated CP.  Duration 15 minutes; occurred after a very stressful day at work. No SOB or chest pain with exertion.  Review of Systems  Constitutional: Negative for fever, chills, diaphoresis, activity change, appetite change and fatigue.  Respiratory: Negative for cough and shortness of breath.   Cardiovascular: Negative for chest pain, palpitations and leg swelling.  Gastrointestinal: Negative for nausea, vomiting, abdominal pain and diarrhea.  Endocrine: Negative for cold intolerance, heat intolerance, polydipsia, polyphagia and polyuria.  Musculoskeletal: Positive for arthralgias.  Skin: Negative for color change, rash and wound.  Neurological: Negative for dizziness, tremors, seizures, syncope, facial asymmetry, speech difficulty, weakness, light-headedness, numbness and headaches.  Psychiatric/Behavioral: Negative for suicidal ideas, sleep disturbance, self-injury and dysphoric mood. The patient is nervous/anxious.     Past Medical History  Diagnosis Date    . Hypertension   . Hyperlipidemia   . Glucose intolerance (impaired glucose tolerance)    No past surgical history on file. No Known Allergies Current Outpatient Prescriptions  Medication Sig Dispense Refill  . amLODipine (NORVASC) 10 MG tablet Take 1 tablet (10 mg total) by mouth daily. 90 tablet 1   No current facility-administered medications for this visit.   Social History   Social History  . Marital Status: Married    Spouse Name: N/A  . Number of Children: N/A  . Years of Education: N/A   Occupational History  . Not on file.   Social History Main Topics  . Smoking status: Never Smoker   . Smokeless tobacco: Not on file  . Alcohol Use: 0.0 oz/week    0 Standard drinks or equivalent per week     Comment: RARE BEER  . Drug Use: No  . Sexual Activity: Yes    Birth Control/ Protection: None   Other Topics Concern  . Not on file   Social History Narrative   Marital status: married x 20 years; from [place]; [place] since 1989.      Children:  2 children (19, 15); no grandchildren.      Lives: with wife, 2 children.       Employment: Holiday representative work since age 62.        Tobacco: none      Alcohol: rare; socially      Drugs: none      Exercise: work is physically demanding.      Seatbelt:  100%      Guns:  none   Family History  Problem Relation Age of Onset  . Diabetes Mother   .  Hypertension Mother   . Hypertension Father   . Mental illness Sister   . Stroke Brother   . Hypertension Brother   . Hypertension Sister        Objective:    BP 126/86 mmHg  Pulse 69  Temp(Src) 98.4 F (36.9 C) (Oral)  Resp 16  Ht 5' 3.5" (1.613 m)  Wt 240 lb (108.863 kg)  BMI 41.84 kg/m2  SpO2 98% Physical Exam  Constitutional: He is oriented to person, place, and time. He appears well-developed and well-nourished. No distress.  obese  HENT:  Head: Normocephalic and atraumatic.  Right Ear: External ear normal.  Left Ear: External ear normal.  Nose: Nose normal.   Mouth/Throat: Oropharynx is clear and moist.  Eyes: Conjunctivae and EOM are normal. Pupils are equal, round, and reactive to light.  Neck: Normal range of motion. Neck supple. Carotid bruit is not present. No thyromegaly present.  Cardiovascular: Normal rate, regular rhythm, normal heart sounds and intact distal pulses.  Exam reveals no gallop and no friction rub.   No murmur heard. Pulmonary/Chest: Effort normal and breath sounds normal. He has no wheezes. He has no rales.  Abdominal: Soft. Bowel sounds are normal. He exhibits no distension and no mass. There is no tenderness. There is no rebound and no guarding.  Lymphadenopathy:    He has no cervical adenopathy.  Neurological: He is alert and oriented to person, place, and time. No cranial nerve deficit.  Skin: Skin is warm and dry. No rash noted. He is not diaphoretic.  Psychiatric: He has a normal mood and affect. His behavior is normal.  Nursing note and vitals reviewed.       Assessment & Plan:   1. Essential hypertension   2. Glucose intolerance (impaired glucose tolerance)   3. Pure hypercholesterolemia   4. Obesity    1. HTN: controlled; obtain labs; refill provided; RTC six months. 2.  Glucose intolerance: New. Recommend weight loss, exercise, and low-sugar food choices. Avoid regular soda, fruit juice; decrease tortillas to 5 per meal.  3.  Hypercholesterolemia: uncontrolled; if remains elevated, will add Atorvastatin. 4. Obesity: highly recommend exercise, low-fat and low-calorie food choices; avoid sweetened beverages. 5. R knee pain: improved; s/p ortho consultation; s/p injection.   Orders Placed This Encounter  Procedures  . CBC with Differential/Platelet  . Comprehensive metabolic panel    Order Specific Question:  Has the patient fasted?    Answer:  Yes  . Hemoglobin A1c  . Lipid panel    Order Specific Question:  Has the patient fasted?    Answer:  Yes   Meds ordered this encounter  Medications  .  amLODipine (NORVASC) 10 MG tablet    Sig: Take 1 tablet (10 mg total) by mouth daily.    Dispense:  90 tablet    Refill:  1    Return in about 6 months (around 01/31/2016) for complete physical examiniation.    Edward Vasquez, M.D. [facility]  [place] [address] [place], [place]  [id number] [phone] phone [phone] fax

## 2015-08-06 MED ORDER — ATORVASTATIN CALCIUM 10 MG PO TABS: 10.0000 mg | ORAL_TABLET | Freq: Every day | ORAL | Status: DC

## 2015-08-06 NOTE — Addendum Note (Signed)
Addended by: Ethelda ChickSMITH, Leeah Politano M on: 08/06/2015 10:55 PM   Modules accepted: Orders

## 2015-11-21 ENCOUNTER — Ambulatory Visit (INDEPENDENT_AMBULATORY_CARE_PROVIDER_SITE_OTHER): Payer: BLUE CROSS/BLUE SHIELD | Admitting: Family Medicine

## 2015-11-21 VITALS — BP 140/84 | HR 67 | Temp 98.6°F | Resp 17 | Ht 63.5 in | Wt 241.0 lb

## 2015-11-21 DIAGNOSIS — M545 Low back pain, unspecified: Secondary | ICD-10-CM | POA: Insufficient documentation

## 2015-11-21 MED ORDER — CYCLOBENZAPRINE HCL 10 MG PO TABS: 10.0000 mg | ORAL_TABLET | Freq: Three times a day (TID) | ORAL | 0 refills | Status: DC | PRN

## 2015-11-21 MED ORDER — NAPROXEN 500 MG PO TABS: 500.0000 mg | ORAL_TABLET | Freq: Two times a day (BID) | ORAL | 0 refills | Status: DC

## 2015-11-21 NOTE — Progress Notes (Signed)
   Subjective:    Patient ID: Edward Vasquez, male    DOB: 1965/12/10, 50 y.o.   MRN: [id number]  HPI Presents with 2 week history of low back pain.  No injury, works [employer] representative[employer].  Denies radicular symptoms, numbness, paresthesias, bowel/bladder incontinence, fevers, chills.  He does not exercise on a regular basis.  Worse with flexion, better with extension.  Denies weakness of his lower extremities.    Active Ambulatory Problems    Diagnosis Date Noted  . HEMORRHOIDS-EXTERNAL 05/29/2010  . ENTEROCOLITIS 05/29/2010  . HTN (hypertension) 06/28/2011  . Obesity 01/16/2015  . Glucose intolerance (impaired glucose tolerance) 08/01/2015  . Pure hypercholesterolemia 08/01/2015  . Acute bilateral low back pain without sciatica 11/21/2015   Resolved Ambulatory Problems    Diagnosis Date Noted  . No Resolved Ambulatory Problems   Past Medical History:  Diagnosis Date  . Glucose intolerance (impaired glucose tolerance)   . Hyperlipidemia   . Hypertension      Review of Systems  Constitutional: Negative for chills, fever and unexpected weight change.  Cardiovascular: Negative for chest pain and leg swelling.  Genitourinary: Negative for difficulty urinating, dysuria and urgency.  Musculoskeletal: Positive for back pain. Negative for joint swelling and myalgias.  Skin: Negative for pallor, rash and wound.  Neurological: Negative for dizziness, weakness and numbness.  All other systems reviewed and are negative.      Objective:   Physical Exam  Constitutional: He is oriented to person, place, and time. He appears well-developed and well-nourished. No distress.  HENT:  Head: Normocephalic and atraumatic.  Eyes: Conjunctivae are normal. No scleral icterus.  Neck: Normal range of motion. Neck supple.  Pulmonary/Chest: Effort normal and breath sounds normal.  Musculoskeletal: He exhibits no edema or deformity.  No deformity seen, has TTP on b/l paraspinal muscles in low back.  No  point midline tenderness in lumbar spine.  Full ROM of lumbar spine.  Muscle strength intact in b/l LE.  Patellar DTR's intact. Sensation intact and pulses intact.  Neurological: He is alert and oriented to person, place, and time. He has normal reflexes. He displays normal reflexes. He exhibits normal muscle tone. Coordination normal.  Skin: Skin is warm. No rash noted. He is not diaphoretic. No erythema. No pallor.  Psychiatric: He has a normal mood and affect. His behavior is normal. Judgment and thought content normal.  Nursing note and vitals reviewed.         Assessment & Plan:  Acute bilateral low back pain without sciatica Gave NSAID's, flexeril and referral to PT.  Follow up as needed, but will need follow up for BP.  Gave precautions for return.    Needs follow up for elevated BP in office.   Signed,  Corliss MarcusAlicia Barnes, DO [facility]

## 2015-11-21 NOTE — Assessment & Plan Note (Signed)
Gave NSAID's, flexeril and referral to PT.  Follow up as needed, but will need follow up for BP.  Gave precautions for return.

## 2015-11-21 NOTE — Patient Instructions (Addendum)
  Please call if having significant pain down the leg, numbness.  Please go to ED if having bowel/bladder incontinence.  Follow up as needed.     IF you received an x-ray today, you will receive an invoice from [facility]. Please contact [facility] at [phone] with questions or concerns regarding your invoice.   IF you received labwork today, you will receive an invoice from [facility]. Please contact [facility] at [phone] with questions or concerns regarding your invoice.   Our billing staff will not be able to assist you with questions regarding bills from these companies.  You will be contacted with the lab results as soon as they are available. The fastest way to get your results is to activate your My Chart account. Instructions are located on the last page of this paperwork. If you have not heard from us regarding the results in 2 weeks, please contact this office.

## 2015-11-22 NOTE — Progress Notes (Signed)
Patient discussed with Dr. Zachery DauerBarnes. Agree with assessment and plan of care per her note.   Signed,   Meredith StaggersJeffrey Neema Barreira, MD [facility].  11/22/15 5:08 PM

## 2016-02-08 ENCOUNTER — Ambulatory Visit (INDEPENDENT_AMBULATORY_CARE_PROVIDER_SITE_OTHER): Payer: BLUE CROSS/BLUE SHIELD | Admitting: Family Medicine

## 2016-02-08 VITALS — BP 122/82 | HR 66 | Temp 98.5°F | Resp 16 | Wt 238.8 lb

## 2016-02-08 DIAGNOSIS — E7800 Pure hypercholesterolemia, unspecified: Secondary | ICD-10-CM | POA: Diagnosis not present

## 2016-02-08 DIAGNOSIS — R7302 Impaired glucose tolerance (oral): Secondary | ICD-10-CM | POA: Diagnosis not present

## 2016-02-08 DIAGNOSIS — Z6841 Body Mass Index (BMI) 40.0 and over, adult: Secondary | ICD-10-CM

## 2016-02-08 DIAGNOSIS — Z0000 Encounter for general adult medical examination without abnormal findings: Secondary | ICD-10-CM

## 2016-02-08 DIAGNOSIS — I10 Essential (primary) hypertension: Secondary | ICD-10-CM | POA: Diagnosis not present

## 2016-02-08 DIAGNOSIS — Z125 Encounter for screening for malignant neoplasm of prostate: Secondary | ICD-10-CM

## 2016-02-08 DIAGNOSIS — E6609 Other obesity due to excess calories: Secondary | ICD-10-CM | POA: Diagnosis not present

## 2016-02-08 DIAGNOSIS — IMO0001 Reserved for inherently not codable concepts without codable children: Secondary | ICD-10-CM

## 2016-02-08 LAB — COMPREHENSIVE METABOLIC PANEL
ALT: 36 U/L (ref 9–46)
AST: 26 U/L (ref 10–40)
Albumin: 4.6 g/dL (ref 3.6–5.1)
Alkaline Phosphatase: 55 U/L (ref 40–115)
BUN: 11 mg/dL (ref 7–25)
CHLORIDE: 106 mmol/L (ref 98–110)
CO2: 22 mmol/L (ref 20–31)
Calcium: 9.1 mg/dL (ref 8.6–10.3)
Creat: 0.67 mg/dL (ref 0.60–1.35)
GLUCOSE: 94 mg/dL (ref 65–99)
POTASSIUM: 4.1 mmol/L (ref 3.5–5.3)
Sodium: 139 mmol/L (ref 135–146)
Total Bilirubin: 0.9 mg/dL (ref 0.2–1.2)
Total Protein: 7.6 g/dL (ref 6.1–8.1)

## 2016-02-08 LAB — LIPID PANEL
CHOL/HDL RATIO: 5.5 ratio — AB (ref <=5.0)
Cholesterol: 176 mg/dL (ref 125–200)
HDL: 32 mg/dL — ABNORMAL LOW (ref >=40)
LDL Cholesterol: 124 mg/dL (ref <130)
Triglycerides: 100 mg/dL (ref <150)
VLDL: 20 mg/dL (ref <30)

## 2016-02-08 LAB — CBC WITH DIFFERENTIAL/PLATELET
BASOS ABS: 57 {cells}/uL (ref 0–200)
Basophils Relative: 1 %
EOS ABS: 171 {cells}/uL (ref 15–500)
Eosinophils Relative: 3 %
HCT: 44.7 % (ref 38.5–50.0)
Hemoglobin: 15.6 g/dL (ref 13.2–17.1)
LYMPHS PCT: 38 %
Lymphs Abs: 2166 cells/uL (ref 850–3900)
MCH: 31.2 pg (ref 27.0–33.0)
MCHC: 34.9 g/dL (ref 32.0–36.0)
MCV: 89.4 fL (ref 80.0–100.0)
MONOS PCT: 9 %
MPV: 9.6 fL (ref 7.5–12.5)
Monocytes Absolute: 513 cells/uL (ref 200–950)
Neutro Abs: 2793 cells/uL (ref 1500–7800)
Neutrophils Relative %: 49 %
PLATELETS: 358 10*3/uL (ref 140–400)
RBC: 5 MIL/uL (ref 4.20–5.80)
RDW: 13.8 % (ref 11.0–15.0)
WBC: 5.7 10*3/uL (ref 3.8–10.8)

## 2016-02-08 LAB — POCT URINALYSIS DIP (MANUAL ENTRY)
BILIRUBIN UA: NEGATIVE
Bilirubin, UA: NEGATIVE
Blood, UA: NEGATIVE
GLUCOSE UA: NEGATIVE
Leukocytes, UA: NEGATIVE
Nitrite, UA: NEGATIVE
Protein Ur, POC: NEGATIVE
SPEC GRAV UA: 1.015
UROBILINOGEN UA: 0.2
pH, UA: 8.5

## 2016-02-08 LAB — HEMOGLOBIN A1C
Hgb A1c MFr Bld: 5.5 % (ref <5.7)
Mean Plasma Glucose: 111 mg/dL

## 2016-02-08 LAB — TSH: TSH: 1.94 m[IU]/L (ref 0.40–4.50)

## 2016-02-08 LAB — PSA: PSA: 0.4 ng/mL (ref <=4.0)

## 2016-02-08 MED ORDER — ATORVASTATIN CALCIUM 10 MG PO TABS: 10.0000 mg | ORAL_TABLET | Freq: Every day | ORAL | 3 refills | Status: DC

## 2016-02-08 MED ORDER — AMLODIPINE BESYLATE 10 MG PO TABS: 10.0000 mg | ORAL_TABLET | Freq: Every day | ORAL | 1 refills | Status: DC

## 2016-02-08 NOTE — Progress Notes (Signed)
Subjective:    Patient ID: Edward Vasquez, male    DOB: 19-May-1965, 51 y.o.   MRN: [id number]  02/08/2016  Annual Exam (refused flu vaccine)   HPI This 50 y.o. male presents for Complete Physical Examination.  Last physical: 01-16-15 Colonoscopy: 03-2010 colitis; repeat 10 years Influenza: REFUSES Eye exam:  No eye exam; no glasses Dental exam:  4 years in [place].  Immunization History  Administered Date(s) Administered  . Tdap 01/16/2015   BP Readings from Last 3 Encounters:  02/08/16 122/82  11/21/15 140/84  08/01/15 126/86   Wt Readings from Last 3 Encounters:  02/08/16 238 lb 12.8 oz (108.3 kg)  11/21/15 241 lb (109.3 kg)  08/01/15 240 lb (108.9 kg)     Review of Systems  Constitutional: Negative for activity change, appetite change, chills, diaphoresis, fatigue, fever and unexpected weight change.  HENT: Negative for congestion, dental problem, drooling, ear discharge, ear pain, facial swelling, hearing loss, mouth sores, nosebleeds, postnasal drip, rhinorrhea, sinus pressure, sneezing, sore throat, tinnitus, trouble swallowing and voice change.   Eyes: Negative for photophobia, pain, discharge, redness, itching and visual disturbance.  Respiratory: Negative for apnea, cough, choking, chest tightness, shortness of breath, wheezing and stridor.   Cardiovascular: Negative for chest pain, palpitations and leg swelling.  Gastrointestinal: Negative for abdominal pain, blood in stool, constipation, diarrhea, nausea and vomiting.  Endocrine: Negative for cold intolerance, heat intolerance, polydipsia, polyphagia and polyuria.  Genitourinary: Negative for decreased urine volume, difficulty urinating, discharge, dysuria, enuresis, flank pain, frequency, genital sores, hematuria, penile pain, penile swelling, scrotal swelling, testicular pain and urgency.       Nocturia x 1.  Urinary stream is strong.  No ED; sex drive is normal.  Musculoskeletal: Negative for arthralgias,  back pain, gait problem, joint swelling, myalgias, neck pain and neck stiffness.  Skin: Negative for color change, pallor, rash and wound.  Allergic/Immunologic: Negative for environmental allergies, food allergies and immunocompromised state.  Neurological: Negative for dizziness, tremors, seizures, syncope, facial asymmetry, speech difficulty, weakness, light-headedness, numbness and headaches.  Hematological: Negative for adenopathy. Does not bruise/bleed easily.  Psychiatric/Behavioral: Negative for agitation, behavioral problems, confusion, decreased concentration, dysphoric mood, hallucinations, self-injury, sleep disturbance and suicidal ideas. The patient is not nervous/anxious and is not hyperactive.     Past Medical History:  Diagnosis Date  . Glucose intolerance (impaired glucose tolerance)   . Hyperlipidemia   . Hypertension    History reviewed. No pertinent surgical history. No Known Allergies Current Outpatient Prescriptions  Medication Sig Dispense Refill  . amLODipine (NORVASC) 10 MG tablet Take 1 tablet (10 mg total) by mouth daily. 90 tablet 1  . atorvastatin (LIPITOR) 10 MG tablet Take 1 tablet (10 mg total) by mouth daily. 90 tablet 3  . cyclobenzaprine (FLEXERIL) 10 MG tablet Take 1 tablet (10 mg total) by mouth 3 (three) times daily as needed for muscle spasms. 30 tablet 0   No current facility-administered medications for this visit.    Social History   Social History  . Marital status: Married    Spouse name: N/A  . Number of children: N/A  . Years of education: N/A   Occupational History  . Not on file.   Social History Main Topics  . Smoking status: Never Smoker  . Smokeless tobacco: Never Used  . Alcohol use 0.0 oz/week     Comment: RARE BEER  . Drug use: No  . Sexual activity: Yes    Birth control/ protection: None   Other Topics  Concern  . Not on file   Social History Narrative   Marital status: married x 21 years; from [place]; [place] since  1989.      Children:  2 children (20, 17); one grandchildren.      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      Tobacco: none      Alcohol: rare; socially rarely      Drugs: none      Exercise: work is physically demanding.      Seatbelt:  100%      Guns:  none   Family History  Problem Relation Age of Onset  . Diabetes Mother   . Hypertension Mother   . Hypertension Father   . Mental illness Sister   . Stroke Brother   . Hypertension Brother   . Hypertension Sister        Objective:    BP 122/82 (BP Location: Right Arm, Cuff Size: Large)   Pulse 66   Temp 98.5 F (36.9 C) (Oral)   Resp 16   Wt 238 lb 12.8 oz (108.3 kg)   SpO2 98%   BMI 41.64 kg/m  Physical Exam  Constitutional: He is oriented to person, place, and time. He appears well-developed and well-nourished. No distress.  HENT:  Head: Normocephalic and atraumatic.  Right Ear: External ear normal.  Left Ear: External ear normal.  Nose: Nose normal.  Mouth/Throat: Oropharynx is clear and moist.  Eyes: Conjunctivae and EOM are normal. Pupils are equal, round, and reactive to light.  Neck: Normal range of motion. Neck supple. Carotid bruit is not present. No thyromegaly present.  Cardiovascular: Normal rate, regular rhythm, normal heart sounds and intact distal pulses.  Exam reveals no gallop and no friction rub.   No murmur heard. Pulmonary/Chest: Effort normal and breath sounds normal. He has no wheezes. He has no rales.  Abdominal: Soft. Bowel sounds are normal. He exhibits no distension and no mass. There is no tenderness. There is no rebound and no guarding. Hernia confirmed negative in the right inguinal area and confirmed negative in the left inguinal area.  Genitourinary: Testes normal and penis normal.  Musculoskeletal:       Right shoulder: Normal.       Left shoulder: Normal.       Cervical  back: Normal.  Lymphadenopathy:    He has no cervical adenopathy.  Neurological: He is alert and oriented to person, place, and time. He has normal reflexes. No cranial nerve deficit. He exhibits normal muscle tone. Coordination normal.  Skin: Skin is warm and dry. No rash noted. He is not diaphoretic.  Psychiatric: He has a normal mood and affect. His behavior is normal. Judgment and thought content normal.   Results for orders placed or performed in visit on 08/01/15  CBC with Differential/Platelet  Result Value Ref Range   WBC 7.1 3.8 - 10.8 K/uL   RBC 5.03 4.20 - 5.80 MIL/uL   Hemoglobin 15.5 13.2 - 17.1 g/dL   HCT 44.5 38.5 - 50.0 %   MCV 88.5 80.0 - 100.0 fL   MCH 30.8 27.0 - 33.0 pg   MCHC 34.8 32.0 - 36.0 g/dL   RDW 14.5 11.0 - 15.0 %   Platelets 352 140 - 400 K/uL   MPV 10.0 7.5 - 12.5 fL   Neutro Abs 4,260 1,500 - 7,800 cells/uL  Lymphs Abs 1,988 850 - 3,900 cells/uL   Monocytes Absolute 639 200 - 950 cells/uL   Eosinophils Absolute 142 15 - 500 cells/uL   Basophils Absolute 71 0 - 200 cells/uL   Neutrophils Relative % 60 %   Lymphocytes Relative 28 %   Monocytes Relative 9 %   Eosinophils Relative 2 %   Basophils Relative 1 %   Smear Review Criteria for review not met   Comprehensive metabolic panel  Result Value Ref Range   Sodium 139 135 - 146 mmol/L   Potassium 4.0 3.5 - 5.3 mmol/L   Chloride 105 98 - 110 mmol/L   CO2 25 20 - 31 mmol/L   Glucose, Bld 94 65 - 99 mg/dL   BUN 10 7 - 25 mg/dL   Creat 0.60 0.60 - 1.35 mg/dL   Total Bilirubin 0.9 0.2 - 1.2 mg/dL   Alkaline Phosphatase 59 40 - 115 U/L   AST 22 10 - 40 U/L   ALT 32 9 - 46 U/L   Total Protein 7.5 6.1 - 8.1 g/dL   Albumin 4.8 3.6 - 5.1 g/dL   Calcium 9.2 8.6 - 10.3 mg/dL  Hemoglobin A1c  Result Value Ref Range   Hgb A1c MFr Bld 5.9 (H) <5.7 %   Mean Plasma Glucose 123 mg/dL  Lipid panel  Result Value Ref Range   Cholesterol 189 125 - 200 mg/dL   Triglycerides 88 <150 mg/dL   HDL 34 (L) >=40  mg/dL   Total CHOL/HDL Ratio 5.6 (H) <=5.0 Ratio   VLDL 18 <30 mg/dL   LDL Cholesterol 137 (H) <130 mg/dL       Assessment & Plan:   1. Routine physical examination   2. Essential hypertension   3. Glucose intolerance (impaired glucose tolerance)   4. Pure hypercholesterolemia   5. Screening for prostate cancer   6. Class 3 obesity due to excess calories with serious comorbidity and body mass index (BMI) of 40.0 to 44.9 in adult [facility])    -anticipatory guidance --- weight loss; recommend sleep study but pt declined; pt refused flu vaccine. -obtain labs. -refills provided. -RTC six months.   Orders Placed This Encounter  Procedures  . CBC with Differential/Platelet  . Comprehensive metabolic panel    Order Specific Question:   Has the patient fasted?    Answer:   Yes  . Lipid panel    Order Specific Question:   Has the patient fasted?    Answer:   Yes  . TSH  . PSA  . Hemoglobin A1c  . POCT urinalysis dipstick  . EKG 12-Lead   Meds ordered this encounter  Medications  . amLODipine (NORVASC) 10 MG tablet    Sig: Take 1 tablet (10 mg total) by mouth daily.    Dispense:  90 tablet    Refill:  1  . atorvastatin (LIPITOR) 10 MG tablet    Sig: Take 1 tablet (10 mg total) by mouth daily.    Dispense:  90 tablet    Refill:  3    Return in about 6 months (around 08/08/2016) for recheck high blood pressure, high cholesterol.   Tiffane Sheldon Elayne Guerin, M.D. [facility] [address] [place], [place]  [phone] phone [phone] fax

## 2016-02-08 NOTE — Patient Instructions (Addendum)
   IF you received an x-ray today, you will receive an invoice from [facility]. Please contact [facility] at [phone] with questions or concerns regarding your invoice.   IF you received labwork today, you will receive an invoice from [facility]. Please contact [facility] at [phone] with questions or concerns regarding your invoice.   Our billing staff will not be able to assist you with questions regarding bills from these companies.  You will be contacted with the lab results as soon as they are available. The fastest way to get your results is to activate your My Chart account. Instructions are located on the last page of this paperwork. If you have not heard from us regarding the results in 2 weeks, please contact this office.    Keeping you healthy  Get these tests  Blood pressure- Have your blood pressure checked once a year by your healthcare provider.  Normal blood pressure is 120/80.  Weight- Have your body mass index (BMI) calculated to screen for obesity.  BMI is a measure of body fat based on height and weight. You can also calculate your own BMI at www.nhlbisupport.com/bmi/.  Cholesterol- Have your cholesterol checked regularly starting at age 35, sooner may be necessary if you have diabetes, high blood pressure, if a family member developed heart diseases at an early age or if you smoke.   Chlamydia, HIV, and other sexual transmitted disease- Get screened each year until the age of 25 then within three months of each new sexual partner.  Diabetes- Have your blood sugar checked regularly if you have high blood pressure, high cholesterol, a family history of diabetes or if you are overweight.  Get these vaccines  Flu shot- Every fall.  Tetanus shot- Every 10 years.  Menactra- Single dose; prevents meningitis.  Take these steps  Don't smoke- If you do smoke, ask your healthcare provider about quitting. For tips on  how to quit, go to www.smokefree.gov or call 1-800-QUIT-NOW.  Be physically active- Exercise 5 days a week for at least 30 minutes.  If you are not already physically active start slow and gradually work up to 30 minutes of moderate physical activity.  Examples of moderate activity include walking briskly, mowing the yard, dancing, swimming bicycling, etc.  Eat a healthy diet- Eat a variety of healthy foods such as fruits, vegetables, low fat milk, low fat cheese, yogurt, lean meats, poultry, fish, beans, tofu, etc.  For more information on healthy eating, go to www.thenutritionsource.org  Drink alcohol in moderation- Limit alcohol intake two drinks or less a day.  Never drink and drive.  Dentist- Brush and floss teeth twice daily; visit your dentis twice a year.  Depression-Your emotional health is as important as your physical health.  If you're feeling down, losing interest in things you normally enjoy please talk with your healthcare provider.  Gun Safety- If you keep a gun in your home, keep it unloaded and with the safety lock on.  Bullets should be stored separately.  Helmet use- Always wear a helmet when riding a motorcycle, bicycle, rollerblading or skateboarding.  Safe sex- If you may be exposed to a sexually transmitted infection, use a condom  Seat belts- Seat bels can save your life; always wear one.  Smoke/Carbon Monoxide detectors- These detectors need to be installed on the appropriate level of your home.  Replace batteries at least once a year.  Skin Cancer- When out in the sun, cover up and use sunscreen SPF   15 or higher.  Violence- If anyone is threatening or hurting you, please tell your healthcare provider. 

## 2016-04-20 DIAGNOSIS — J101 Influenza due to other identified influenza virus with other respiratory manifestations: Secondary | ICD-10-CM | POA: Diagnosis not present

## 2016-06-04 ENCOUNTER — Encounter: Payer: Self-pay | Admitting: Family Medicine

## 2016-06-04 MED ORDER — ATORVASTATIN CALCIUM 20 MG PO TABS: 20.0000 mg | ORAL_TABLET | Freq: Every day | ORAL | 3 refills | Status: DC

## 2016-06-04 NOTE — Addendum Note (Signed)
Addended by: Ethelda ChickSMITH, Thyra Yinger M on: 06/04/2016 01:49 PM   Modules accepted: Orders

## 2016-08-13 ENCOUNTER — Ambulatory Visit (INDEPENDENT_AMBULATORY_CARE_PROVIDER_SITE_OTHER): Payer: BLUE CROSS/BLUE SHIELD | Admitting: Family Medicine

## 2016-08-13 VITALS — BP 142/84 | HR 70 | Temp 98.5°F | Resp 16 | Ht 63.5 in | Wt 235.0 lb

## 2016-08-13 DIAGNOSIS — I10 Essential (primary) hypertension: Secondary | ICD-10-CM | POA: Diagnosis not present

## 2016-08-13 DIAGNOSIS — E7800 Pure hypercholesterolemia, unspecified: Secondary | ICD-10-CM

## 2016-08-13 DIAGNOSIS — Z6841 Body Mass Index (BMI) 40.0 and over, adult: Secondary | ICD-10-CM | POA: Diagnosis not present

## 2016-08-13 DIAGNOSIS — R7302 Impaired glucose tolerance (oral): Secondary | ICD-10-CM

## 2016-08-13 MED ORDER — LOSARTAN POTASSIUM 25 MG PO TABS: 25.0000 mg | ORAL_TABLET | Freq: Every day | ORAL | 1 refills | Status: DC

## 2016-08-13 MED ORDER — AMLODIPINE BESYLATE 10 MG PO TABS: 10.0000 mg | ORAL_TABLET | Freq: Every day | ORAL | 1 refills | Status: DC

## 2016-08-13 MED ORDER — METHOCARBAMOL 500 MG PO TABS: 500.0000 mg | ORAL_TABLET | Freq: Three times a day (TID) | ORAL | 0 refills | Status: DC | PRN

## 2016-08-13 MED ORDER — ATORVASTATIN CALCIUM 20 MG PO TABS: 20.0000 mg | ORAL_TABLET | Freq: Every day | ORAL | 3 refills | Status: DC

## 2016-08-13 NOTE — Progress Notes (Signed)
Subjective:    Patient ID: Edward Vasquez, male    DOB: 25-Oct-1965, 51 y.o.   MRN: [id number]  08/13/2016  Follow-up (BP)   HPI This 51 y.o. male presents for evaluation of hypertension.  Patient reports good compliance with medication, good tolerance to medication, and good symptom control.   At home, BP running 140s/80s.  Weight down again 3 pounds.  Vision is blurry far away.  Last eye exam several years ago; 7-10 years ago.   Low back will stiffen intermittently.  Likes to be busy.    BP Readings from Last 3 Encounters:  08/13/16 (!) 142/84  02/08/16 122/82  11/21/15 140/84   Wt Readings from Last 3 Encounters:  08/13/16 235 lb (106.6 kg)  02/08/16 238 lb 12.8 oz (108.3 kg)  11/21/15 241 lb (109.3 kg)   Immunization History  Administered Date(s) Administered  . Tdap 01/16/2015    Review of Systems  Constitutional: Negative for activity change, appetite change, chills, diaphoresis, fatigue and fever.  Respiratory: Negative for cough and shortness of breath.   Cardiovascular: Negative for chest pain, palpitations and leg swelling.  Gastrointestinal: Negative for abdominal pain, diarrhea, nausea and vomiting.  Endocrine: Negative for cold intolerance, heat intolerance, polydipsia, polyphagia and polyuria.  Musculoskeletal: Positive for back pain.  Skin: Negative for color change, rash and wound.  Neurological: Positive for tremors. Negative for dizziness, seizures, syncope, facial asymmetry, speech difficulty, weakness, light-headedness, numbness and headaches.  Psychiatric/Behavioral: Negative for dysphoric mood and sleep disturbance. The patient is not nervous/anxious.     Past Medical History:  Diagnosis Date  . Glucose intolerance (impaired glucose tolerance)   . Hyperlipidemia   . Hypertension    No past surgical history on file. No Known Allergies  Social History   Social History  . Marital status: Married    Spouse name: N/A  . Number of children:  N/A  . Years of education: N/A   Occupational History  . Not on file.   Social History Main Topics  . Smoking status: Never Smoker  . Smokeless tobacco: Never Used  . Alcohol use 0.0 oz/week     Comment: RARE BEER  . Drug use: No  . Sexual activity: Yes    Birth control/ protection: None   Other Topics Concern  . Not on file   Social History Narrative   Marital status: married x 21 years; from [place]; [place] since 1989.      Children:  2 children (20, 17); one grandchildren.      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      Tobacco: none      Alcohol: rare; socially rarely      Drugs: none      Exercise: work is physically demanding.      Seatbelt:  100%      Guns:  none   Family History  Problem Relation Age of Onset  . Diabetes Mother   . Hypertension Mother   . Hypertension Father   . Mental illness Sister   . Stroke Brother   . Hypertension Brother   . Hypertension Sister        Objective:    BP (!) 142/84   Pulse 70   Temp 98.5 F (36.9 C) (Oral)   Resp 16   Ht 5' 3.5" (1.613 m)   Wt 235 lb (106.6 kg)  SpO2 97%   BMI 40.98 kg/m  Physical Exam  Constitutional: He is oriented to person, place, and time. He appears well-developed and well-nourished. No distress.  HENT:  Head: Normocephalic and atraumatic.  Right Ear: External ear normal.  Left Ear: External ear normal.  Nose: Nose normal.  Mouth/Throat: Oropharynx is clear and moist.  Eyes: Conjunctivae and EOM are normal. Pupils are equal, round, and reactive to light.  Neck: Normal range of motion. Neck supple. Carotid bruit is not present. No thyromegaly present.  Cardiovascular: Normal rate, regular rhythm, normal heart sounds and intact distal pulses.  Exam reveals no gallop and no friction rub.   No murmur heard. Pulmonary/Chest: Effort normal and breath sounds normal. He has no wheezes. He  has no rales.  Abdominal: Soft. Bowel sounds are normal. He exhibits no distension and no mass. There is no tenderness. There is no rebound and no guarding.  Musculoskeletal:       Lumbar back: He exhibits pain. He exhibits normal range of motion, no tenderness, no bony tenderness, no spasm and normal pulse.  Lymphadenopathy:    He has no cervical adenopathy.  Neurological: He is alert and oriented to person, place, and time. No cranial nerve deficit.  Skin: Skin is warm and dry. No rash noted. He is not diaphoretic.  Psychiatric: He has a normal mood and affect. His behavior is normal.  Nursing note and vitals reviewed.       Assessment & Plan:   1. Essential hypertension   2. Glucose intolerance (impaired glucose tolerance)   3. Pure hypercholesterolemia   4. Class 3 severe obesity due to excess calories with serious comorbidity and body mass index (BMI) of 40.0 to 44.9 in adult [facility])    -borderline blood pressure readings; add Losartan  daily; continue Amlodipine. -increase Atorvastatin to  daily; obtain labs. -rx for Robaxin provided for low back sprain. -recommend weight loss, exercise, and mediterranean diet.   Orders Placed This Encounter  Procedures  . CBC with Differential/Platelet  . Comprehensive metabolic panel    Order Specific Question:   Has the patient fasted?    Answer:   Yes  . Hemoglobin A1c  . Lipid panel    Order Specific Question:   Has the patient fasted?    Answer:   Yes   Meds ordered this encounter  Medications  . amLODipine (NORVASC) 10 MG tablet    Sig: Take 1 tablet (10 mg total) by mouth daily.    Dispense:  90 tablet    Refill:  1  . atorvastatin (LIPITOR) 20 MG tablet    Sig: Take 1 tablet (20 mg total) by mouth daily.    Dispense:  90 tablet    Refill:  3    Please delete further refills of Atorvastatin  daily; this will replace it.  Marland Kitchen losartan (COZAAR) 25 MG tablet    Sig: Take 1 tablet (25 mg total) by mouth daily.     Dispense:  90 tablet    Refill:  1  . methocarbamol (ROBAXIN) 500 MG tablet    Sig: Take 1-2 tablets (500-1,000 mg total) by mouth every 8 (eight) hours as needed for muscle spasms.    Dispense:  45 tablet    Refill:  0    Return in about 6 months (around 02/13/2017) for complete physical examiniation.   Lateefa Crosby Paulita Fujita, M.D. [facility] [address] [place], [place]  [address] [phone] phone [phone] fax

## 2016-08-13 NOTE — Patient Instructions (Addendum)
   IF you received an x-ray today, you will receive an invoice from [facility]. Please contact [facility] at [phone] with questions or concerns regarding your invoice.   IF you received labwork today, you will receive an invoice from [facility]. Please contact [facility] at [phone] with questions or concerns regarding your invoice.   Our billing staff will not be able to assist you with questions regarding bills from these companies.  You will be contacted with the lab results as soon as they are available. The fastest way to get your results is to activate your My Chart account. Instructions are located on the last page of this paperwork. If you have not heard from us regarding the results in 2 weeks, please contact this office.    Plan de alimentacin DASH (DASH Eating Plan) DASH es la sigla en ingls de "Enfoques Alimentarios para Detener la Hipertensin". El plan de alimentacin DASH ha demostrado bajar la presin arterial elevada (hipertensin). Los beneficios adicionales para la salud pueden incluir la disminucin del riesgo de diabetes mellitus tipo2, enfermedades cardacas e ictus. Este plan tambin puede ayudar a adelgazar. QU DEBO SABER ACERCA DEL PLAN DE ALIMENTACIN DASH? Para el plan de alimentacin DASH, seguir las siguientes pautas generales:  Elija los alimentos que contienen menos de 150 miligramos de sodio por porcin (segn se indica en la etiqueta de los alimentos).  Use hierbas o aderezos sin sal, en lugar de sal de mesa o sal marina.  Consulte al mdico o farmacutico antes de usar sustitutos de la sal.  Consuma los productos con menor contenido de sodio. Estos productos suelen estar etiquetados como "bajo en sodio" o "sin agregado de sal".  Coma alimentos frescos. No consuma una gran cantidad de alimentos enlatados.  Coma ms verduras, frutas y productos lcteos con bajo contenido de grasas.  Elija los cereales integrales. Busque la  palabra "integral" en el primer lugar de la lista de ingredientes.  Elija el pescado y el pollo o el pavo sin piel ms a menudo que las carnes rojas. Limite el consumo de pescado, carne de ave y carne a 6onzas (170g) por da.  Limite el consumo de dulces, postres, azcares y bebidas azucaradas.  Elija las grasas saludables para el corazn.  Consuma ms comida casera y menos de restaurante, de buf y comida rpida.  Limite el consumo de alimentos fritos.  No fra los alimentos. A la hora de cocinarlos, opte por hornearlos, hervirlos, grillarlos y asarlos a la parrilla.  Cuando coma en un restaurante, pida que preparen su comida con menos sal o, en lo posible, sin nada de sal. QU ALIMENTOS PUEDO COMER? Pida ayuda a un nutricionista para conocer las necesidades calricas individuales. Cereales  Pan de salvado o integral. Arroz integral. Pastas de salvado o integrales. Quinua, trigo burgol y cereales integrales. Cereales con bajo contenido de sodio. Tortillas de harina de maz o de salvado. Pan de maz integral. Galletas saladas integrales. Galletas con bajo contenido de sodio. Vegetales  Verduras frescas o congeladas (crudas, al vapor, asadas o grilladas). Jugos de tomate y verduras con contenido bajo o reducido de sodio. Pasta y salsa de tomate con contenido bajo o reducido de sodio. Verduras enlatadas con bajo contenido de sodio o reducido de sodio. Frutas  Frutas frescas, en conserva (en su jugo natural) o frutas congeladas. Carnes y otros productos con protenas  Carne de res molida (al 85% o ms magra), carne de res de animales alimentados con pastos o carne de res sin   o pavo sin piel. Carne de pollo o de [place]. Cerdo sin la grasa. Todos los pescados y frutos de mar. Huevos. Porotos, guisantes o lentejas secos. Frutos secos y semillas sin sal. Frijoles enlatados sin sal. Lcteos Productos lcteos con bajo contenido de grasas, como [place] o al 1%,  quesos reducidos en grasas o al 2%, ricota con bajo contenido de grasas o [employer], o yogur natural con bajo contenido de [place]. Quesos con contenido bajo o reducido de sodio. Grasas y Writer en barra que no contengan grasas trans. [place] y alios para ensaladas livianos o reducidos en grasas (reducidos en sodio). [place]. Aceites de crtamo, oliva o canola. Mantequilla natural de man o almendra. Otros Palomitas de maz y pretzels sin sal. Los artculos mencionados arriba pueden no ser [employer] de las bebidas o los alimentos recomendados. Comunquese con el nutricionista para conocer ms opciones. QU ALIMENTOS NO SE RECOMIENDAN? Cereales Pan blanco. Pastas blancas. Arroz blanco. Pan de maz refinado. Bagels y croissants. Galletas saladas que contengan grasas trans. Vegetales Vegetales con crema o fritos. Verduras en salsa de [place]. Verduras enlatadas comunes. Pasta y salsa de tomate en lata comunes. Jugos comunes de tomate y de verduras. Nils Pyle Fruta enlatada en almbar liviano o espeso. Jugo de frutas. Carnes y otros productos con protenas [place] de carne con Holiday representative. Costillas, alas de pollo, tocineta, salchicha, mortadela, salame, chinchulines, tocino, perros calientes, salchichas alemanas y embutidos envasados. Frutos secos y semillas con sal. Frijoles con sal en lata. Lcteos Leche entera o al 2%, crema, mezcla de [place] y crema, y queso crema. Yogur entero o endulzado. Quesos o queso azul con alto contenido de Neurosurgeon. [place]remas no lcteas y coberturas batidas. Quesos procesados, quesos para untar o cuajadas. Condimentos Sal de cebolla y ajo, sal condimentada, sal de mesa y sal marina. Salsas en lata y envasadas. Salsa [place]. Salsa trtara. Salsa barbacoa. Salsa teriyaki. Salsa de soja, incluso la que tiene contenido reducido de [place]. Salsa de carne. Salsa de pescado. Salsa de [place]. Salsa rosada. Rbano picante. Ketchup y mostaza. Saborizantes y  tiernizantes para carne. Caldo en cubitos. Salsa picante. Salsa tabasco. Adobos. Aderezos para tacos. Salsas. Grasas y [address], [place] en barra, [place], [place], [place] clarificada y Steffanie [place] de tocino. Aceites de coco, de palmiste o de palma. Aderezos comunes para ensalada. Otros Pickles y [place]. Palomitas de maz y pretzels con sal. Los artculos mencionados arriba pueden no ser [employer] de las bebidas y los alimentos que se Theatre stage manager. Comunquese con el nutricionista para obtener ms informacin. DNDE Edward Mora MS INFORMACIN? Instit[facility] Nacional del [place], del Pulmn y de Risk manager (National Heart, Lung, and Blood Institute): [email or web address] Esta informacin no tiene Theme park manager el consejo del mdico. Asegrese de hacerle al mdico cualquier pregunta que tenga. Document Released: 03/21/2011 Document Revised: 07/24/2015 Document Reviewed: 02/03/2013 Elsevier Interactive Patient Education  2017 [employer].

## 2016-08-14 LAB — LIPID PANEL
CHOLESTEROL TOTAL: 133 mg/dL (ref 100–199)
Chol/HDL Ratio: 3.5 ratio (ref 0.0–5.0)
HDL: 38 mg/dL — ABNORMAL LOW (ref >39)
LDL Calculated: 64 mg/dL (ref 0–99)
Triglycerides: 154 mg/dL — ABNORMAL HIGH (ref 0–149)
VLDL CHOLESTEROL CAL: 31 mg/dL (ref 5–40)

## 2016-08-14 LAB — COMPREHENSIVE METABOLIC PANEL
A/G RATIO: 1.7 (ref 1.2–2.2)
ALT: 32 IU/L (ref 0–44)
AST: 25 IU/L (ref 0–40)
Albumin: 4.6 g/dL (ref 3.5–5.5)
Alkaline Phosphatase: 66 IU/L (ref 39–117)
BUN/Creatinine Ratio: 19 (ref 9–20)
BUN: 13 mg/dL (ref 6–24)
Bilirubin Total: 0.4 mg/dL (ref 0.0–1.2)
CALCIUM: 9.5 mg/dL (ref 8.7–10.2)
CO2: 22 mmol/L (ref 18–29)
Chloride: 103 mmol/L (ref 96–106)
Creatinine, Ser: 0.67 mg/dL — ABNORMAL LOW (ref 0.76–1.27)
GFR calc Af Amer: 131 mL/min/{1.73_m2} (ref >59)
GFR, EST NON AFRICAN AMERICAN: 114 mL/min/{1.73_m2} (ref >59)
GLUCOSE: 94 mg/dL (ref 65–99)
Globulin, Total: 2.7 g/dL (ref 1.5–4.5)
POTASSIUM: 4.1 mmol/L (ref 3.5–5.2)
Sodium: 142 mmol/L (ref 134–144)
Total Protein: 7.3 g/dL (ref 6.0–8.5)

## 2016-08-14 LAB — CBC WITH DIFFERENTIAL/PLATELET
BASOS ABS: 0.1 10*3/uL (ref 0.0–0.2)
Basos: 1 %
EOS (ABSOLUTE): 0.2 10*3/uL (ref 0.0–0.4)
Eos: 2 %
HEMOGLOBIN: 14.9 g/dL (ref 13.0–17.7)
Hematocrit: 43.9 % (ref 37.5–51.0)
IMMATURE GRANS (ABS): 0 10*3/uL (ref 0.0–0.1)
IMMATURE GRANULOCYTES: 0 %
LYMPHS: 25 %
Lymphocytes Absolute: 2.3 10*3/uL (ref 0.7–3.1)
MCH: 30.5 pg (ref 26.6–33.0)
MCHC: 33.9 g/dL (ref 31.5–35.7)
MCV: 90 fL (ref 79–97)
MONOCYTES: 10 %
Monocytes Absolute: 0.9 10*3/uL (ref 0.1–0.9)
NEUTROS PCT: 62 %
Neutrophils Absolute: 5.7 10*3/uL (ref 1.4–7.0)
PLATELETS: 355 10*3/uL (ref 150–379)
RBC: 4.88 x10E6/uL (ref 4.14–5.80)
RDW: 14.7 % (ref 12.3–15.4)
WBC: 9.2 10*3/uL (ref 3.4–10.8)

## 2016-08-14 LAB — HEMOGLOBIN A1C
ESTIMATED AVERAGE GLUCOSE: 117 mg/dL
Hgb A1c MFr Bld: 5.7 % — ABNORMAL HIGH (ref 4.8–5.6)

## 2016-12-02 ENCOUNTER — Other Ambulatory Visit: Payer: Self-pay | Admitting: Family Medicine

## 2016-12-12 ENCOUNTER — Telehealth: Payer: Self-pay | Admitting: Family Medicine

## 2016-12-12 NOTE — Telephone Encounter (Signed)
Please release lab results 

## 2016-12-16 ENCOUNTER — Encounter: Payer: Self-pay | Admitting: Family Medicine

## 2016-12-16 NOTE — Telephone Encounter (Signed)
Call --- Labs are overall NORMAL:   -No evidence of anemia. -Sugar/glucose is normal. -Liver and kidney functions are normal. -Cholesterol is under good control.

## 2016-12-18 NOTE — Telephone Encounter (Signed)
LVM that labs came back normal. Advised to call back with any other questions.

## 2017-02-26 ENCOUNTER — Encounter: Payer: BLUE CROSS/BLUE SHIELD | Admitting: Family Medicine

## 2017-04-02 ENCOUNTER — Ambulatory Visit (INDEPENDENT_AMBULATORY_CARE_PROVIDER_SITE_OTHER): Payer: BLUE CROSS/BLUE SHIELD | Admitting: Family Medicine

## 2017-04-02 ENCOUNTER — Other Ambulatory Visit: Payer: Self-pay

## 2017-04-02 ENCOUNTER — Ambulatory Visit (INDEPENDENT_AMBULATORY_CARE_PROVIDER_SITE_OTHER): Payer: BLUE CROSS/BLUE SHIELD

## 2017-04-02 ENCOUNTER — Encounter: Payer: Self-pay | Admitting: Family Medicine

## 2017-04-02 VITALS — BP 126/78 | HR 69 | Temp 98.2°F | Resp 16 | Ht 64.57 in | Wt 237.0 lb

## 2017-04-02 DIAGNOSIS — M545 Low back pain, unspecified: Secondary | ICD-10-CM

## 2017-04-02 DIAGNOSIS — Z23 Encounter for immunization: Secondary | ICD-10-CM

## 2017-04-02 DIAGNOSIS — Z125 Encounter for screening for malignant neoplasm of prostate: Secondary | ICD-10-CM

## 2017-04-02 DIAGNOSIS — Z6841 Body Mass Index (BMI) 40.0 and over, adult: Secondary | ICD-10-CM | POA: Diagnosis not present

## 2017-04-02 DIAGNOSIS — R7302 Impaired glucose tolerance (oral): Secondary | ICD-10-CM | POA: Diagnosis not present

## 2017-04-02 DIAGNOSIS — E7800 Pure hypercholesterolemia, unspecified: Secondary | ICD-10-CM | POA: Diagnosis not present

## 2017-04-02 DIAGNOSIS — I10 Essential (primary) hypertension: Secondary | ICD-10-CM

## 2017-04-02 DIAGNOSIS — Z0000 Encounter for general adult medical examination without abnormal findings: Secondary | ICD-10-CM | POA: Diagnosis not present

## 2017-04-02 DIAGNOSIS — M47816 Spondylosis without myelopathy or radiculopathy, lumbar region: Secondary | ICD-10-CM | POA: Diagnosis not present

## 2017-04-02 LAB — POCT URINALYSIS DIP (MANUAL ENTRY)
Bilirubin, UA: NEGATIVE
GLUCOSE UA: NEGATIVE mg/dL
Ketones, POC UA: NEGATIVE mg/dL
Leukocytes, UA: NEGATIVE
NITRITE UA: NEGATIVE
Protein Ur, POC: NEGATIVE mg/dL
Spec Grav, UA: 1.025 (ref 1.010–1.025)
UROBILINOGEN UA: 0.2 U/dL
pH, UA: 6 (ref 5.0–8.0)

## 2017-04-02 MED ORDER — ATORVASTATIN CALCIUM 20 MG PO TABS: 20.0000 mg | ORAL_TABLET | Freq: Every day | ORAL | 3 refills | Status: DC

## 2017-04-02 MED ORDER — CYCLOBENZAPRINE HCL 10 MG PO TABS: 10.0000 mg | ORAL_TABLET | Freq: Three times a day (TID) | ORAL | 0 refills | Status: DC | PRN

## 2017-04-02 MED ORDER — LOSARTAN POTASSIUM 25 MG PO TABS: 25.0000 mg | ORAL_TABLET | Freq: Every day | ORAL | 1 refills | Status: DC

## 2017-04-02 MED ORDER — AMLODIPINE BESYLATE 10 MG PO TABS: 10.0000 mg | ORAL_TABLET | Freq: Every day | ORAL | 1 refills | Status: DC

## 2017-04-02 NOTE — Progress Notes (Signed)
Subjective:    Patient ID: Edward Vasquez, male    DOB: Aug 27, 1965, 51 y.o.   MRN: [id number]  04/02/2017  Annual Exam    HPI This 51 y.o. male presents for Complete Physical Examination.  Last physical:  02-08-2016 Eye exam:  2018; no glasses; needs readers Dental exam:  2018; needs a cleaning.   Visual Acuity Screening   Right eye Left eye Both eyes  Without correction: 20/20 20/20 20/20   With correction:       BP Readings from Last 3 Encounters:  04/02/17 126/78  08/13/16 (!) 142/84  02/08/16 122/82   Wt Readings from Last 3 Encounters:  04/02/17 237 lb (107.5 kg)  08/13/16 235 lb (106.6 kg)  02/08/16 238 lb 12.8 oz (108.3 kg)   Immunization History  Administered Date(s) Administered  . Tdap 01/16/2015   Management at last visit included:  -borderline blood pressure readings; add Losartan 25mg  daily; continue Amlodipine. -increase Atorvastatin to 20mg  daily; obtain labs. -rx for Robaxin provided for low back sprain. -recommend weight loss, exercise, and mediterranean diet.   Review of Systems  Constitutional: Negative for activity change, appetite change, chills, diaphoresis, fatigue, fever and unexpected weight change.  HENT: Negative for congestion, dental problem, drooling, ear discharge, ear pain, facial swelling, hearing loss, mouth sores, nosebleeds, postnasal drip, rhinorrhea, sinus pressure, sneezing, sore throat, tinnitus, trouble swallowing and voice change.   Eyes: Negative for photophobia, pain, discharge, redness, itching and visual disturbance.  Respiratory: Negative for apnea, cough, choking, chest tightness, shortness of breath, wheezing and stridor.   Cardiovascular: Negative for chest pain, palpitations and leg swelling.  Gastrointestinal: Positive for abdominal pain. Negative for blood in stool, constipation, diarrhea, nausea and vomiting.  Endocrine: Negative for cold intolerance, heat intolerance, polydipsia, polyphagia and polyuria.   Genitourinary: Negative for decreased urine volume, difficulty urinating, discharge, dysuria, enuresis, flank pain, frequency, genital sores, hematuria, penile pain, penile swelling, scrotal swelling, testicular pain and urgency.  Musculoskeletal: Positive for back pain. Negative for arthralgias, gait problem, joint swelling, myalgias, neck pain and neck stiffness.  Skin: Negative for color change, pallor, rash and wound.  Allergic/Immunologic: Negative for environmental allergies, food allergies and immunocompromised state.  Neurological: Negative for dizziness, tremors, seizures, syncope, facial asymmetry, speech difficulty, weakness, light-headedness, numbness and headaches.  Hematological: Negative for adenopathy. Does not bruise/bleed easily.  Psychiatric/Behavioral: Negative for agitation, behavioral problems, confusion, decreased concentration, dysphoric mood, hallucinations, self-injury, sleep disturbance and suicidal ideas. The patient is not nervous/anxious and is not hyperactive.     Past Medical History:  Diagnosis Date  . Glucose intolerance (impaired glucose tolerance)   . Hyperlipidemia   . Hypertension    History reviewed. No pertinent surgical history. No Known Allergies No current outpatient medications on file prior to visit.   No current facility-administered medications on file prior to visit.    Social History   Socioeconomic History  . Marital status: Married    Spouse name: Not on file  . Number of children: Not on file  . Years of education: Not on file  . Highest education level: Not on file  Social Needs  . Financial resource strain: Not on file  . Food insecurity - worry: Not on file  . Food insecurity - inability: Not on file  . Transportation needs - medical: Not on file  . Transportation needs - non-medical: Not on file  Occupational History  . Not on file  Tobacco Use  . Smoking status: Never Smoker  . Smokeless tobacco: Never  Used  Substance  and Sexual Activity  . Alcohol use: Yes    Alcohol/week: 0.0 oz    Comment: [employer]  . Drug use: No  . Sexual activity: Yes    Birth control/protection: None  Other Topics Concern  . Not on file  Social History Narrative   Marital status: married x 22 years; [place] 1989.      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      Tobacco: none      Alcohol: rare; socially rarely      Drugs: none      Exercise: work is physically demanding.      Seatbelt:  100%      Guns:  none   Family History  Problem Relation Age of Onset  . Diabetes Mother   . Hypertension Mother   . Hypertension Father   . Mental illness Sister   . Stroke Brother   . Hypertension Brother   . Hypertension Sister        Objective:    BP 126/78   Pulse 69   Temp 98.2 F (36.8 C) (Oral)   Resp 16   Ht 5' 4.57" (1.64 m)   Wt 237 lb (107.5 kg)   SpO2 97%   BMI 39.97 kg/m  Physical Exam  Constitutional: He is oriented to person, place, and time. He appears well-developed and well-nourished. No distress.  HENT:  Head: Normocephalic and atraumatic.  Right Ear: External ear normal.  Left Ear: External ear normal.  Nose: Nose normal.  Mouth/Throat: Oropharynx is clear and moist.  Eyes: Conjunctivae and EOM are normal. Pupils are equal, round, and reactive to light.  Neck: Normal range of motion. Neck supple. Carotid bruit is not present. No thyromegaly present.  Cardiovascular: Normal rate, regular rhythm, normal heart sounds and intact distal pulses. Exam reveals no gallop and no friction rub.  No murmur heard. Pulmonary/Chest: Effort normal and breath sounds normal. He has no wheezes. He has no rales.  Abdominal: Soft. Bowel sounds are normal. He exhibits no distension and no mass. There is no tenderness. There is  no rebound and no guarding. Hernia confirmed negative in the right inguinal area and confirmed negative in the left inguinal area.  Genitourinary: Testes normal and penis normal.  Musculoskeletal:       Right shoulder: Normal.       Left shoulder: Normal.       Cervical back: Normal.  Lymphadenopathy:    He has no cervical adenopathy.       Right: No inguinal adenopathy present.       Left: No inguinal adenopathy present.  Neurological: He is alert and oriented to person, place, and time. He has normal reflexes. No cranial nerve deficit. He exhibits normal muscle tone. Coordination normal.  Skin: Skin is warm and dry. No rash noted. He is not diaphoretic.  Psychiatric: He has a normal mood and affect. His behavior is normal. Judgment and thought content normal.   No results found. Depression screen [facility] 04/02/2017 08/13/2016 02/08/2016 11/21/2015 08/01/2015  Decreased Interest 0 0 0 0 0  Down, Depressed, Hopeless 0 0 0 0 0  PHQ - 2 Score 0 0 0 0 0   Fall Risk  04/02/2017 08/13/2016 02/08/2016  11/21/2015 08/01/2015  Falls in the past year? No No No No No        Assessment & Plan:   1. Routine physical examination   2. Essential hypertension   3. Glucose intolerance (impaired glucose tolerance)   4. Pure hypercholesterolemia   5. Class 3 severe obesity due to excess calories with serious comorbidity and body mass index (BMI) of 40.0 to 44.9 in adult (HCC)   6. Screening for prostate cancer   7. Need for prophylactic vaccination and inoculation against influenza   8. Acute right-sided low back pain without sciatica     -anticipatory guidance provided --- exercise, weight loss, safe driving practices, aspirin 81mg  daily. -obtain age appropriate screening labs and labs for chronic disease management. -Persistent and worsening lower back pain.  Obtai lumbar spine films.  Refills of muscle relaxer and anti-inflammatories provided.  Recommend weight loss to decrease burden on lower spine.   If worsens or develops radicular symptoms, refer to orthopedics for further consultation.   Orders Placed This Encounter  Procedures  . DG Lumbar Spine Complete    Standing Status:   Future    Standing Expiration Date:   04/02/2018    Order Specific Question:   Reason for Exam (SYMPTOM  OR DIAGNOSIS REQUIRED)    Answer:   R lower back pain    Order Specific Question:   Preferred imaging location?    Answer:   External  . Flu Vaccine QUAD 36+ mos IM  . CBC with Differential/Platelet  . Comprehensive metabolic panel  . Hemoglobin A1c  . Lipid panel  . PSA  . TSH  . POCT urinalysis dipstick  . EKG 12-Lead   Meds ordered this encounter  Medications  . amLODipine (NORVASC) 10 MG tablet    Sig: Take 1 tablet (10 mg total) by mouth daily.    Dispense:  90 tablet    Refill:  1  . atorvastatin (LIPITOR) 20 MG tablet    Sig: Take 1 tablet (20 mg total) by mouth daily.    Dispense:  90 tablet    Refill:  3    Please delete further refills of Atorvastatin 10mg  daily; this will replace it.  Marland Kitchen losartan (COZAAR) 25 MG tablet    Sig: Take 1 tablet (25 mg total) by mouth daily.    Dispense:  90 tablet    Refill:  1  . cyclobenzaprine (FLEXERIL) 10 MG tablet    Sig: Take 1 tablet (10 mg total) by mouth 3 (three) times daily as needed for muscle spasms.    Dispense:  60 tablet    Refill:  0    Return in about 6 months (around 10/01/2017) for follow-up chronic medical conditions.   Amir Glaus Paulita Fujita, M.D. Primary Care at [facility] previously [facility] [address] [place], [place]  [address] [phone] phone [phone] fax

## 2017-04-02 NOTE — Patient Instructions (Addendum)
IF you received an x-ray today, you will receive an invoice from [facility]. Please contact [facility] at [phone] with questions or concerns regarding your invoice.   IF you received labwork today, you will receive an invoice from [facility]. Please contact [facility] at [phone] with questions or concerns regarding your invoice.   Our billing staff will not be able to assist you with questions regarding bills from these companies.  You will be contacted with the lab results as soon as they are available. The fastest way to get your results is to activate your My Chart account. Instructions are located on the last page of this paperwork. If you have not heard from [place] regarding the results in 2 weeks, please contact this office.      Preventive Care 40-64 Years, Male Preventive care refers to lifestyle choices and visits with your health care provider that can promote health and wellness. What does preventive care include?  A yearly physical exam. This is also called an annual well check.  Dental exams once or twice a year.  Routine eye exams. Ask your health care provider how often you should have your eyes checked.  Personal lifestyle choices, including: ? Daily care of your teeth and gums. ? Regular physical activity. ? Eating a healthy diet. ? Avoiding tobacco and drug use. ? Limiting alcohol use. ? Practicing safe sex. ? Taking low-dose aspirin every day starting at age 37. What happens during an annual well check? The services and screenings done by your health care provider during your annual well check will depend on your age, overall health, lifestyle risk factors, and family history of disease. Counseling Your health care provider may ask you questions about your:  Alcohol use.  Tobacco use.  Drug use.  Emotional well-being.  Home and relationship well-being.  Sexual activity.  Eating habits.  Work and work  Statistician.  Screening You may have the following tests or measurements:  Height, weight, and BMI.  Blood pressure.  Lipid and cholesterol levels. These may be checked every 5 years, or more frequently if you are over 40 years old.  Skin check.  Lung cancer screening. You may have this screening every year starting at age 33 if you have a 30-pack-year history of smoking and currently smoke or have quit within the past 15 years.  Fecal occult blood test (FOBT) of the stool. You may have this test every year starting at age 66.  Flexible sigmoidoscopy or colonoscopy. You may have a sigmoidoscopy every 5 years or a colonoscopy every 10 years starting at age 77.  Prostate cancer screening. Recommendations will vary depending on your family history and other risks.  Hepatitis C blood test.  Hepatitis B blood test.  Sexually transmitted disease (STD) testing.  Diabetes screening. This is done by checking your blood sugar (glucose) after you have not eaten for a while (fasting). You may have this done every 1-3 years.  Discuss your test results, treatment options, and if necessary, the need for more tests with your health care provider. Vaccines Your health care provider may recommend certain vaccines, such as:  Influenza vaccine. This is recommended every year.  Tetanus, diphtheria, and acellular pertussis (Tdap, Td) vaccine. You may need a Td booster every 10 years.  Varicella vaccine. You may need this if you have not been vaccinated.  Zoster vaccine. You may need this after age 68.  Measles, mumps, and rubella (MMR) vaccine. You may need at least one dose  of MMR if you were born in 1957 or later. You may also need a second dose.  Pneumococcal 13-valent conjugate (PCV13) vaccine. You may need this if you have certain conditions and have not been vaccinated.  Pneumococcal polysaccharide (PPSV23) vaccine. You may need one or two doses if you smoke cigarettes or if you have  certain conditions.  Meningococcal vaccine. You may need this if you have certain conditions.  Hepatitis A vaccine. You may need this if you have certain conditions or if you travel or work in places where you may be exposed to hepatitis A.  Hepatitis B vaccine. You may need this if you have certain conditions or if you travel or work in places where you may be exposed to hepatitis B.  Haemophilus influenzae type b (Hib) vaccine. You may need this if you have certain risk factors.  Talk to your health care provider about which screenings and vaccines you need and how often you need them. This information is not intended to replace advice given to you by your health care provider. Make sure you discuss any questions you have with your health care provider. Document Released: 04/28/2015 Document Revised: 12/20/2015 Document Reviewed: 01/31/2015 Elsevier Interactive Patient Education  [employer].

## 2017-04-03 ENCOUNTER — Encounter: Payer: Self-pay | Admitting: Family Medicine

## 2017-04-03 LAB — COMPREHENSIVE METABOLIC PANEL
ALBUMIN: 4.5 g/dL (ref 3.5–5.5)
ALK PHOS: 65 IU/L (ref 39–117)
ALT: 29 IU/L (ref 0–44)
AST: 24 IU/L (ref 0–40)
Albumin/Globulin Ratio: 1.9 (ref 1.2–2.2)
BUN / CREAT RATIO: 20 (ref 9–20)
BUN: 12 mg/dL (ref 6–24)
Bilirubin Total: 0.9 mg/dL (ref 0.0–1.2)
CO2: 20 mmol/L (ref 20–29)
CREATININE: 0.6 mg/dL — AB (ref 0.76–1.27)
Calcium: 9 mg/dL (ref 8.7–10.2)
Chloride: 107 mmol/L — ABNORMAL HIGH (ref 96–106)
GFR calc non Af Amer: 118 mL/min/{1.73_m2} (ref >59)
GFR, EST AFRICAN AMERICAN: 136 mL/min/{1.73_m2} (ref >59)
GLOBULIN, TOTAL: 2.4 g/dL (ref 1.5–4.5)
Glucose: 85 mg/dL (ref 65–99)
Potassium: 4.2 mmol/L (ref 3.5–5.2)
SODIUM: 143 mmol/L (ref 134–144)
TOTAL PROTEIN: 6.9 g/dL (ref 6.0–8.5)

## 2017-04-03 LAB — HEMOGLOBIN A1C
Est. average glucose Bld gHb Est-mCnc: 123 mg/dL
HEMOGLOBIN A1C: 5.9 % — AB (ref 4.8–5.6)

## 2017-04-03 LAB — CBC WITH DIFFERENTIAL/PLATELET
BASOS ABS: 0.1 10*3/uL (ref 0.0–0.2)
Basos: 1 %
EOS (ABSOLUTE): 0.2 10*3/uL (ref 0.0–0.4)
EOS: 3 %
HEMOGLOBIN: 15.1 g/dL (ref 13.0–17.7)
Hematocrit: 44.9 % (ref 37.5–51.0)
Immature Grans (Abs): 0 10*3/uL (ref 0.0–0.1)
Immature Granulocytes: 0 %
LYMPHS ABS: 2.2 10*3/uL (ref 0.7–3.1)
Lymphs: 36 %
MCH: 31 pg (ref 26.6–33.0)
MCHC: 33.6 g/dL (ref 31.5–35.7)
MCV: 92 fL (ref 79–97)
MONOCYTES: 8 %
Monocytes Absolute: 0.5 10*3/uL (ref 0.1–0.9)
NEUTROS ABS: 3.1 10*3/uL (ref 1.4–7.0)
Neutrophils: 52 %
Platelets: 371 10*3/uL (ref 150–379)
RBC: 4.87 x10E6/uL (ref 4.14–5.80)
RDW: 15.2 % (ref 12.3–15.4)
WBC: 6 10*3/uL (ref 3.4–10.8)

## 2017-04-03 LAB — LIPID PANEL
CHOL/HDL RATIO: 4.3 ratio (ref 0.0–5.0)
Cholesterol, Total: 168 mg/dL (ref 100–199)
HDL: 39 mg/dL — AB (ref >39)
LDL CALC: 117 mg/dL — AB (ref 0–99)
Triglycerides: 58 mg/dL (ref 0–149)
VLDL CHOLESTEROL CAL: 12 mg/dL (ref 5–40)

## 2017-04-03 LAB — PSA: PROSTATE SPECIFIC AG, SERUM: 0.3 ng/mL (ref 0.0–4.0)

## 2017-04-03 LAB — TSH: TSH: 2.47 u[IU]/mL (ref 0.450–4.500)

## 2017-09-04 ENCOUNTER — Encounter: Payer: Self-pay | Admitting: Family Medicine

## 2017-09-10 ENCOUNTER — Ambulatory Visit (INDEPENDENT_AMBULATORY_CARE_PROVIDER_SITE_OTHER): Payer: BLUE CROSS/BLUE SHIELD | Admitting: Family Medicine

## 2017-09-10 ENCOUNTER — Other Ambulatory Visit: Payer: Self-pay

## 2017-09-10 ENCOUNTER — Encounter: Payer: Self-pay | Admitting: Family Medicine

## 2017-09-10 VITALS — BP 148/80 | HR 72 | Temp 98.0°F | Resp 16 | Ht 64.17 in | Wt 235.0 lb

## 2017-09-10 DIAGNOSIS — R7302 Impaired glucose tolerance (oral): Secondary | ICD-10-CM

## 2017-09-10 DIAGNOSIS — I10 Essential (primary) hypertension: Secondary | ICD-10-CM | POA: Diagnosis not present

## 2017-09-10 DIAGNOSIS — E7800 Pure hypercholesterolemia, unspecified: Secondary | ICD-10-CM

## 2017-09-10 LAB — GLUCOSE, POCT (MANUAL RESULT ENTRY): POC Glucose: 87 mg/dl (ref 70–99)

## 2017-09-10 MED ORDER — LISINOPRIL 5 MG PO TABS: 5.0000 mg | ORAL_TABLET | Freq: Every day | ORAL | 1 refills | Status: DC

## 2017-09-10 MED ORDER — ATORVASTATIN CALCIUM 20 MG PO TABS: 20.0000 mg | ORAL_TABLET | Freq: Every day | ORAL | 3 refills | Status: DC

## 2017-09-10 MED ORDER — AMLODIPINE BESYLATE 10 MG PO TABS: 10.0000 mg | ORAL_TABLET | Freq: Every day | ORAL | 1 refills | Status: DC

## 2017-09-10 NOTE — Progress Notes (Signed)
Subjective:    Patient ID: Edward Vasquez, male    DOB: 07/01/1965, 52 y.o.   MRN: [id number]  09/10/2017  Chronic Conditions (6 month follow-up )    HPI [name] presents for six month follow-up of hypertension, glucose intolerance, hypercholesterolemia, morbid obesity. Management changes made at last visit include the following:  -Persistent and worsening lower back pain.  Obtai lumbar spine films.  Refills of muscle relaxer and anti-inflammatories provided.  Recommend weight loss to decrease burden on lower spine.  If worsens or develops radicular symptoms, refer to orthopedics for further consultation.  of hypertension, glucose intolerance, hypercholesterolemia, obesity. -Lab results from 03/2017: No evidence of anemia. Thyroid function or TSH is normal. PSA or prostate level is normal. Liver and kidney functions are normal. Sugar/glucose is normal. Hemoglobin A1c is a 41-month average of sugars. Your level is slightly elevated at 5.9. This is consistent with a prediabetic state.  I recommend weight loss, exercise, and low-carbohydrate low-sugar food choices. You should AVOID: regular sodas, sweetened tea, fruit juices. You should LIMIT: breads, pastas, rice, potatoes, and desserts/sweets. I would recommend limiting your total carbohydrate intake per meal to 45 grams; I would limit your total carbohydrate intake per snack to 30 grams. I would also have a goal of 60 grams of protein intake per day; this would equal 10-15 grams of protein per meal and 5-10 grams of protein per snack. Cholesterol is moderately controlled yet 40 points higher than last check 8 months ago. Please remember to take your cholesterol medication every day. Urine is normal. EKG is normal.   UPDATE FROM December 2018: Patient reports good compliance with medication, good tolerance to medication, and good symptom control.   Lower back pain has improved; not sure why or how.   Not checking Bp  at home.    BP Readings from Last 3 Encounters:  09/10/17 (!) 148/80  04/02/17 126/78  08/13/16 (!) 142/84   Wt Readings from Last 3 Encounters:  09/10/17 235 lb (106.6 kg)  04/02/17 237 lb (107.5 kg)  08/13/16 235 lb (106.6 kg)   Immunization History  Administered Date(s) Administered  . Influenza,inj,Quad PF,6+ Mos 04/02/2017  . Tdap 01/16/2015    Review of Systems  Constitutional: Negative for activity change, appetite change, chills, diaphoresis, fatigue, fever and unexpected weight change.  HENT: Negative for congestion, dental problem, drooling, ear discharge, ear pain, facial swelling, hearing loss, mouth sores, nosebleeds, postnasal drip, rhinorrhea, sinus pressure, sneezing, sore throat, tinnitus, trouble swallowing and voice change.   Eyes: Negative for photophobia, pain, discharge, redness, itching and visual disturbance.  Respiratory: Negative for apnea, cough, choking, chest tightness, shortness of breath, wheezing and stridor.   Cardiovascular: Negative for chest pain, palpitations and leg swelling.  Gastrointestinal: Negative for abdominal pain, blood in stool, constipation, diarrhea, nausea and vomiting.  Endocrine: Negative for cold intolerance, heat intolerance, polydipsia, polyphagia and polyuria.  Genitourinary: Negative for decreased urine volume, difficulty urinating, discharge, dysuria, enuresis, flank pain, frequency, genital sores, hematuria, penile pain, penile swelling, scrotal swelling, testicular pain and urgency.  Musculoskeletal: Negative for arthralgias, back pain, gait problem, joint swelling, myalgias, neck pain and neck stiffness.  Skin: Negative for color change, pallor, rash and wound.  Allergic/Immunologic: Negative for environmental allergies, food allergies and immunocompromised state.  Neurological: Negative for dizziness, tremors, seizures, syncope, facial asymmetry, speech difficulty, weakness, light-headedness, numbness and headaches.   Hematological: Negative for adenopathy. Does not bruise/bleed easily.  Psychiatric/Behavioral: Negative for agitation, behavioral problems, confusion, decreased  concentration, dysphoric mood, hallucinations, self-injury, sleep disturbance and suicidal ideas. The patient is not nervous/anxious and is not hyperactive.     Past Medical History:  Diagnosis Date  . Glucose intolerance (impaired glucose tolerance)   . Hyperlipidemia   . Hypertension    History reviewed. No pertinent surgical history. No Known Allergies Current Outpatient Medications on File Prior to Visit  Medication Sig Dispense Refill  . cyclobenzaprine (FLEXERIL) 10 MG tablet Take 1 tablet (10 mg total) by mouth 3 (three) times daily as needed for muscle spasms. 60 tablet 0  . PREVIDENT 5000 SENSITIVE 1.1-5 % PSTE   1   No current facility-administered medications on file prior to visit.    Social History   Socioeconomic History  . Marital status: Married    Spouse name: Not on file  . Number of children: Not on file  . Years of education: Not on file  . Highest education level: Not on file  Occupational History  . Not on file  Social Needs  . Financial resource strain: Not on file  . Food insecurity:    Worry: Not on file    Inability: Not on file  . Transportation needs:    Medical: Not on file    Non-medical: Not on file  Tobacco Use  . Smoking status: Never Smoker  . Smokeless tobacco: Never Used  Substance and Sexual Activity  . Alcohol use: Yes    Alcohol/week: 0.0 oz    Comment: RARE BEER  . Drug use: No  . Sexual activity: Yes    Birth control/protection: None  Lifestyle  . Physical activity:    Days per week: Not on file    Minutes per session: Not on file  . Stress: Not on file  Relationships  . Social connections:    Talks on phone: Not on file    Gets together: Not on file    Attends religious service: Not on file    Active member of club or organization: Not on file    Attends  meetings of clubs or organizations: Not on file    Relationship status: Not on file  . Intimate partner violence:    Fear of current or ex partner: Not on file    Emotionally abused: Not on file    Physically abused: Not on file    Forced sexual activity: Not on file  Other Topics Concern  . Not on file  Social History Narrative   Marital status: married x 22 years; from [place]; [place] since 1989.      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      Tobacco: none      Alcohol: rare; socially rarely      Drugs: none      Exercise: work is physically demanding.      Seatbelt:  100%      Guns:  none   Family History  Problem Relation Age of Onset  . Diabetes Mother   . Hypertension Mother   . Hypertension Father   . Mental illness Sister   . Stroke Brother   . Hypertension Brother   . Hypertension Sister        Objective:    BP (!) 148/80   Pulse 72   Temp 98  F (36.7 C) (Oral)   Resp 16   Ht 5' 4.17" (1.63 m)   Wt 235 lb (106.6 kg)   SpO2 96%   BMI 40.12 kg/m  Physical Exam  Constitutional: He is oriented to person, place, and time. He appears well-developed and well-nourished. No distress.  HENT:  Head: Normocephalic and atraumatic.  Right Ear: External ear normal.  Left Ear: External ear normal.  Nose: Nose normal.  Mouth/Throat: Oropharynx is clear and moist.  Eyes: Pupils are equal, round, and reactive to light. Conjunctivae and EOM are normal.  Neck: Normal range of motion. Neck supple. Carotid bruit is not present. No thyromegaly present.  Cardiovascular: Normal rate, regular rhythm, normal heart sounds and intact distal pulses. Exam reveals no gallop and no friction rub.  No murmur heard. Pulmonary/Chest: Effort normal and breath sounds normal. He has no wheezes. He has no rales.   Abdominal: Soft. Bowel sounds are normal. He exhibits no distension and no mass. There is no tenderness. There is no rebound and no guarding.  Musculoskeletal:       Right shoulder: Normal.       Left shoulder: Normal.       Cervical back: Normal.  Lymphadenopathy:    He has no cervical adenopathy.  Neurological: He is alert and oriented to person, place, and time. He has normal reflexes. No cranial nerve deficit. He exhibits normal muscle tone. Coordination normal.  Skin: Skin is warm and dry. No rash noted. He is not diaphoretic.  Psychiatric: He has a normal mood and affect. His behavior is normal. Judgment and thought content normal.  Nursing note and vitals reviewed.  No results found. Depression screen [facility] 2/9 09/10/2017 04/02/2017 08/13/2016 02/08/2016 11/21/2015  Decreased Interest 0 0 0 0 0  Down, Depressed, Hopeless 0 0 0 0 0  PHQ - 2 Score 0 0 0 0 0   Fall Risk  09/10/2017 04/02/2017 08/13/2016 02/08/2016 11/21/2015  Falls in the past year? No No No No No        Assessment & Plan:   1. Essential hypertension   2. Glucose intolerance (impaired glucose tolerance)   3. Pure hypercholesterolemia   4. Morbid obesity (HCC)     Controlled hypertension, glucose intolerance, hypercholesterolemia.  Obtain labs for chronic disease management; refills provided without adjustments.  Morbid obesity: Recommend weight loss, exercise for 30-60 minutes five days per week; recommend 1800 kcal restriction per day with a minimum of 60 grams of protein per day.  Eat 3 meals per day. Do not skip meals. Consider having a protein shake as a meal replacement to aid with eliminating meal skipping. Look for products with <220 calories, <7 gm sugar, and 20-30 gm protein.  Eat breakfast within 2 hours of getting up.   Make  your plate non-starchy vegetables,  protein, and  carbohydrates at lunch and dinner.   Aim for at least 64 oz. of calorie-free beverages daily (water, Crystal Light, diet green  tea, etc.). Eliminate any sugary beverages such as regular soda, sweet tea, or fruit juice.   Pay attention to hunger and fullness cues.  Stop eating once you feel satisfied; don't wait until you feel full, stuffed, or sick from eating.  Choose lean meats and low fat/fat free dairy products.  Choose foods high in fiber such as fruits, vegetables, and whole grains (brown rice, whole wheat pasta, whole wheat bread, etc.).  Limit foods with added sugar to <7 gm per serving.  Always eat in the kitchen/dining  room.  Never eat in the bedroom or in front of the TV.    Orders Placed This Encounter  Procedures  . CBC with Differential/Platelet  . Comprehensive metabolic panel    Order Specific Question:   Has the patient fasted?    Answer:   No  . Hemoglobin A1c  . Lipid panel    Order Specific Question:   Has the patient fasted?    Answer:   No  . POCT glucose (manual entry)   Meds ordered this encounter  Medications  . amLODipine (NORVASC) 10 MG tablet    Sig: Take 1 tablet (10 mg total) by mouth daily.    Dispense:  90 tablet    Refill:  1  . lisinopril (PRINIVIL,ZESTRIL) 5 MG tablet    Sig: Take 1 tablet (5 mg total) by mouth daily.    Dispense:  90 tablet    Refill:  1  . atorvastatin (LIPITOR) 20 MG tablet    Sig: Take 1 tablet (20 mg total) by mouth daily.    Dispense:  90 tablet    Refill:  3    Return in about 6 months (around 03/13/2018) for complete physical examiniation [facility].   Kristi Paulita Fujita, M.D. Primary Care at [facility] previously [facility] [address] [place], [place]  [address] [phone] phone [phone] fax

## 2017-09-10 NOTE — Patient Instructions (Addendum)
IF you received an x-ray today, you will receive an invoice from [facility]. Please contact [facility] at [phone] with questions or concerns regarding your invoice.   IF you received labwork today, you will receive an invoice from [facility]. Please contact [facility] at [phone] with questions or concerns regarding your invoice.   Our billing staff will not be able to assist you with questions regarding bills from these companies.  You will be contacted with the lab results as soon as they are available. The fastest way to get your results is to activate your My Chart account. Instructions are located on the last page of this paperwork. If you have not heard from [place] regarding the results in 2 weeks, please contact this office.      Hipertensin Hypertension La hipertensin, conocida comnmente como presin arterial alta, se produce cuando la sangre bombea en las arterias con mucha fuerza. Las arterias son los vasos sanguneos que transportan la sangre desde el corazn al resto del cuerpo. La hipertensin hace que el corazn haga ms esfuerzo para Insurance account manager y [employer] que las arterias se Armed forces training and education officer o Multimedia programmer. La hipertensin no tratada o no controlada puede causar infarto de miocardio, accidentes cerebrovasculares, enfermedad renal y otros problemas. Una lectura de la presin arterial consiste de un nmero ms alto sobre un nmero ms bajo. En condiciones ideales, la presin arterial debe estar por debajo de 120/80. El primer nmero ("superior") es la presin sistlica. Es la medida de la presin de las arterias cuando el corazn late. El segundo nmero ("inferior") es la presin diastlica. Es la medida de la presin en las arterias cuando el corazn se relaja. Cules son las causas? Se desconoce la causa de esta afeccin. Qu incrementa el riesgo? Algunos factores de riesgo de hipertensin estn bajo su control. Otros no. Factores que puede  [employer].  Tener diabetes mellitus tipo 2, colesterol alto, o ambos.  No hacer la cantidad suficiente de actividad fsica o ejercicio.  Tener sobrepeso.  Consumir mucha grasa, azcar, caloras o sal (sodio) en su dieta.  Beber alcohol en exceso. Factores que son difciles o imposibles de modificar  Tener enfermedad renal crnica.  Tener antecedentes familiares de presin arterial alta.  La edad. Los riesgos aumentan con la edad.  La raza. El riesgo es mayor para las Statistician.  El sexo. Antes de los 45aos, los hombres corren ms [employer]. Despus de los 65aos, las mujeres corren ms [employer].  Tener apnea obstructiva del sueo.  El estrs. Cules son los signos o los sntomas? La presin arterial extremadamente alta (crisis hipertensiva) puede provocar:  Dolor de [place].  Ansiedad.  Falta de aire.  Hemorragia nasal.  Nuseas y vmitos.  Dolor de pecho intenso.  Una crisis de movimientos que no puede controlar (convulsiones).  Cmo se diagnostica? Esta afeccin se diagnostica midiendo su presin arterial mientras se encuentra sentado, con el brazo apoyado sobre una superficie. El brazalete del tensimetro debe colocarse directamente sobre la piel de la parte superior del brazo y al nivel de su corazn. Debe medirla al [facility] veces en el mismo brazo. Determinadas condiciones pueden causar una diferencia de presin arterial entre el brazo izquierdo y Aeronautical engineer. Ciertos factores pueden provocar que las lecturas de la presin arterial sean inferiores o superiores a lo normal (elevadas) por un perodo corto de tiempo:  Si su presin arterial es ms alta cuando se encuentra en el consultorio del mdico  que cuando la mide en su hogar, se denomina "hipertensin de bata blanca". La mayora de las personas que tienen esta afeccin no deben ser medicadas.  Si su presin arterial es ms alta en el hogar que cuando se  encuentra en el consultorio del mdico, se denomina "hipertensin enmascarada". La mayora de las personas que tienen esta afeccin deben ser medicadas para controlar la presin arterial.  Si tiene una lecturas de presin arterial alta durante una visita o si tiene presin arterial normal con otros factores de riesgo:  Es posible que se le pida que regrese otro da para volver a controlar su presin arterial.  Se le puede pedir que se controle la presin arterial en su casa durante 1 semana o ms.  Si se le diagnostica hipertensin, es posible que se le realicen otros anlisis de sangre o estudios de diagnstico por imgenes para ayudar a su mdico a comprender su riesgo general de tener otras afecciones. Cmo se trata? Esta afeccin se trata haciendo cambios saludables en el estilo de vida, tales como ingerir alimentos saludables, realizar ms ejercicio y reducir el consumo de alcohol. El mdico puede recetarle medicamentos si los cambios en el estilo de vida no son suficientes para lograr controlar la presin arterial y si:  Su presin arterial sistlica est por encima de 130.  Su presin arterial diastlica est por encima de 80.  La presin arterial deseada puede variar en funcin de las enfermedades, la edad y otros factores personales. Siga estas instrucciones en su casa: Comida y bebida  Siga una dieta con alto contenido de fibras y potasio, y con bajo contenido de sodio, azcar agregada y grasas. Un ejemplo de plan alimenticio es la dieta DASH (Dietary Approaches to Stop Hypertension, Mtodos alimenticios para detener la hipertensin). Para alimentarse de esta manera: ? Coma mucha fruta y verdura fresca. Trate de que la mitad del plato de cada comida sea de frutas y verduras. ? Coma cereales integrales, como pasta integral, arroz integral y pan integral. Llene aproximadamente un cuarto del plato con cereales integrales. ? Coma y beba productos lcteos con bajo contenido de grasa,  como leche descremada o yogur bajo en grasas. ? Evite la ingesta de cortes de carne grasa, carne procesada o curada, y carne de ave con piel. Llene aproximadamente un cuarto del plato con protenas magras, como pescado, pollo sin piel, frijoles, huevos y tofu. ? Evite ingerir alimentos prehechos o procesados. En general, estos tienen mayor cantidad de sodio, azcar agregada y grasa.  Reduzca su ingesta diaria de sodio. La mayora de las personas que tienen hipertensin deben comer menos de 1500 mg de sodio por da.  Limite el consumo de alcohol a no ms de 1 medida por da si es mujer y no est embarazada y a 2 medidas por da si es hombre. Una medida equivale a 12onzas de cerveza, 5onzas de vino o 1onzas de bebidas alcohlicas de alta graduacin. Estilo de vida  Trabaje con su mdico para mantener un peso saludable o perder peso. Pregntele cual es su peso recomendado.  Realice al menos 30 minutos de ejercicio que haga que se acelere su corazn (ejercicio aerbico) la mayora de los das de la semana. Estas actividades pueden incluir caminar, nadar o andar en bicicleta.  Incluya ejercicios para fortalecer sus msculos (ejercicios de resistencia), como pilates o levantamiento de pesas, como parte de su rutina semanal de ejercicios. Intente realizar <MEASUREMEKoHenrAlcide ClPrincipa<MEASUREMENTKoHenrAlcide ClPrincipa<MEASUREMENTKoHenrAlcide ClPrincipa<MEASUREMENTKoHenrAlcide ClPrincipa<MEASUREMENTKoHenrAlcide ClPrincipa<MEASUREMENTKoHenrAlcide ClPrincipa<MEASUREMENTKoHenrAlcide ClPrincipa<MEASUREMENTKoHenrAlcide ClPrincipal FinanTSherlynn Carbone ejercicios al menos tres das a la semana.  No  ningn producto que contenga nicotina o tabaco, como cigarrillos y cigarrillos electrnicos. Si necesita ayuda para dejar de fumar, consulte al mdico.  Contrlese la presin arterial en su casa segn las indicaciones del mdico.  Concurra a todas las visitas de control como se lo haya indicado el mdico. Esto es importante. Medicamentos  Tome los medicamentos de venta libre y los recetados solamente como se lo haya indicado el mdico. Siga cuidadosamente las indicaciones. Los medicamentos para la presin arterial deben tomarse segn las indicaciones.  No omita  las dosis de medicamentos para la presin arterial. Si lo hace, estar en riesgo de tener problemas y puede hacer que los medicamentos sean menos eficaces.  Pregntele a su mdico a qu efectos secundarios o reacciones a los medicamentos debe prestar atencin. Comunquese con un mdico si:  Piensa que tiene una reaccin a un medicamento que est tomando.  Tiene dolores de cabeza frecuentes (recurrentes).  Siente mareos.  Tiene hinchazn en los tobillos.  Tiene problemas de visin. Solicite ayuda de inmediato si:  Siente un dolor de cabeza intenso o confusin.  Siente debilidad inusual o adormecimiento.  Siente que va a desmayarse.  Siente un dolor intenso en el pecho o el abdomen.  Vomita repetidas veces.  Tiene dificultad para respirar. Resumen  La hipertensin se produce cuando la sangre bombea en las arterias con mucha fuerza. Si esta afeccin no se controla, podra correr riesgo de tener complicaciones graves.  La presin arterial deseada puede variar en funcin de las enfermedades, la edad y otros factores personales. Para la mayora de las personas, una presin arterial normal es menor que 120/80.  La hipertensin se trata con cambios en el estilo de vida, medicamentos o una combinacin de ambos. Los cambios en el estilo de vida incluyen prdida de peso, ingerir alimentos sanos, seguir una dieta baja en sodio, hacer ms ejercicio y limitar el consumo de alcohol. Esta informacin no tiene como fin reemplazar el consejo del mdico. Asegrese de hacerle al mdico cualquier pregunta que tenga. Document Released: 04/01/2005 Document Revised: 03/13/2016 Document Reviewed: 03/13/2016 Elsevier Interactive Patient Education  2018 Elsevier Inc.  

## 2017-09-11 LAB — CBC WITH DIFFERENTIAL/PLATELET
Basophils Absolute: 0.1 10*3/uL (ref 0.0–0.2)
Basos: 1 %
EOS (ABSOLUTE): 0.2 10*3/uL (ref 0.0–0.4)
EOS: 2 %
HEMATOCRIT: 45.7 % (ref 37.5–51.0)
Hemoglobin: 15.2 g/dL (ref 13.0–17.7)
IMMATURE GRANULOCYTES: 0 %
Immature Grans (Abs): 0 10*3/uL (ref 0.0–0.1)
Lymphocytes Absolute: 2.8 10*3/uL (ref 0.7–3.1)
Lymphs: 30 %
MCH: 31.1 pg (ref 26.6–33.0)
MCHC: 33.3 g/dL (ref 31.5–35.7)
MCV: 94 fL (ref 79–97)
MONOS ABS: 0.8 10*3/uL (ref 0.1–0.9)
Monocytes: 9 %
NEUTROS PCT: 58 %
Neutrophils Absolute: 5.4 10*3/uL (ref 1.4–7.0)
PLATELETS: 362 10*3/uL (ref 150–450)
RBC: 4.88 x10E6/uL (ref 4.14–5.80)
RDW: 14.4 % (ref 12.3–15.4)
WBC: 9.3 10*3/uL (ref 3.4–10.8)

## 2017-09-11 LAB — COMPREHENSIVE METABOLIC PANEL
ALT: 19 IU/L (ref 0–44)
AST: 18 IU/L (ref 0–40)
Albumin/Globulin Ratio: 1.9 (ref 1.2–2.2)
Albumin: 4.7 g/dL (ref 3.5–5.5)
Alkaline Phosphatase: 75 IU/L (ref 39–117)
BUN/Creatinine Ratio: 17 (ref 9–20)
BUN: 16 mg/dL (ref 6–24)
Bilirubin Total: 0.3 mg/dL (ref 0.0–1.2)
CALCIUM: 9.6 mg/dL (ref 8.7–10.2)
CO2: 22 mmol/L (ref 20–29)
CREATININE: 0.93 mg/dL (ref 0.76–1.27)
Chloride: 105 mmol/L (ref 96–106)
GFR calc Af Amer: 111 mL/min/{1.73_m2} (ref >59)
GFR, EST NON AFRICAN AMERICAN: 96 mL/min/{1.73_m2} (ref >59)
Globulin, Total: 2.5 g/dL (ref 1.5–4.5)
Glucose: 92 mg/dL (ref 65–99)
Potassium: 4.4 mmol/L (ref 3.5–5.2)
Sodium: 144 mmol/L (ref 134–144)
Total Protein: 7.2 g/dL (ref 6.0–8.5)

## 2017-09-11 LAB — HEMOGLOBIN A1C
ESTIMATED AVERAGE GLUCOSE: 117 mg/dL
Hgb A1c MFr Bld: 5.7 % — ABNORMAL HIGH (ref 4.8–5.6)

## 2017-09-11 LAB — LIPID PANEL
Chol/HDL Ratio: 4.5 ratio (ref 0.0–5.0)
Cholesterol, Total: 166 mg/dL (ref 100–199)
HDL: 37 mg/dL — AB (ref >39)
LDL Calculated: 94 mg/dL (ref 0–99)
Triglycerides: 173 mg/dL — ABNORMAL HIGH (ref 0–149)
VLDL CHOLESTEROL CAL: 35 mg/dL (ref 5–40)

## 2018-03-13 ENCOUNTER — Encounter: Payer: Self-pay | Admitting: Family Medicine

## 2018-03-13 ENCOUNTER — Ambulatory Visit (INDEPENDENT_AMBULATORY_CARE_PROVIDER_SITE_OTHER): Payer: BLUE CROSS/BLUE SHIELD | Admitting: Family Medicine

## 2018-03-13 ENCOUNTER — Other Ambulatory Visit: Payer: Self-pay

## 2018-03-13 VITALS — BP 145/88 | HR 67 | Temp 98.1°F | Ht 64.17 in | Wt 242.0 lb

## 2018-03-13 DIAGNOSIS — I10 Essential (primary) hypertension: Secondary | ICD-10-CM | POA: Diagnosis not present

## 2018-03-13 DIAGNOSIS — E7800 Pure hypercholesterolemia, unspecified: Secondary | ICD-10-CM

## 2018-03-13 DIAGNOSIS — Z130 Encounter for screening for diseases of the blood and blood-forming organs and certain disorders involving the immune mechanism: Secondary | ICD-10-CM | POA: Diagnosis not present

## 2018-03-13 DIAGNOSIS — Z1211 Encounter for screening for malignant neoplasm of colon: Secondary | ICD-10-CM

## 2018-03-13 DIAGNOSIS — Z125 Encounter for screening for malignant neoplasm of prostate: Secondary | ICD-10-CM

## 2018-03-13 DIAGNOSIS — Z23 Encounter for immunization: Secondary | ICD-10-CM | POA: Diagnosis not present

## 2018-03-13 DIAGNOSIS — R7302 Impaired glucose tolerance (oral): Secondary | ICD-10-CM

## 2018-03-13 DIAGNOSIS — Z0000 Encounter for general adult medical examination without abnormal findings: Secondary | ICD-10-CM | POA: Diagnosis not present

## 2018-03-13 MED ORDER — LISINOPRIL 5 MG PO TABS: 5.0000 mg | ORAL_TABLET | Freq: Every day | ORAL | 1 refills | Status: DC

## 2018-03-13 MED ORDER — ATORVASTATIN CALCIUM 20 MG PO TABS: 20.0000 mg | ORAL_TABLET | Freq: Every day | ORAL | 3 refills | Status: DC

## 2018-03-13 MED ORDER — AMLODIPINE BESYLATE 10 MG PO TABS: 10.0000 mg | ORAL_TABLET | Freq: Every day | ORAL | 1 refills | Status: DC

## 2018-03-13 MED ORDER — CYCLOBENZAPRINE HCL 10 MG PO TABS: 10.0000 mg | ORAL_TABLET | Freq: Three times a day (TID) | ORAL | 0 refills | Status: DC | PRN

## 2018-03-13 NOTE — Patient Instructions (Addendum)
regresar en 1-2 semanas para tomarse la presion (favor de tomarse todos su medicamentos), meta es presion de 130/80. si esta por encima favor de que me lo comunique para aumentar dosis de lisinopril  BP check with nurse, goal is 130/80 or less, if above I need to be notified so that I can adjust his lisinopril   If you have lab work done today you will be contacted with your lab results within the next 2 weeks.  If you have not heard from [place] then please contact us. The fastest way to get your results is to register for My Chart.   Cuidados preventivos en los hombres de 40 a 64 aos de edad Preventive Care 40-64 Years, Male Los cuidados preventivos hacen referencia a las opciones en cuanto al estilo de vida y a las visitas al mdico, las cuales pueden promover la salud y Counsellor. Qu incluyen los cuidados preventivos?  Un examen fsico anual. Esto tambin se conoce como control de bienestar anual.  Exmenes dentales [facility] al ao.  Exmenes de la vista de rutina. Pregntele al mdico con qu frecuencia debe realizarse un control de la vista.  Opciones personales de estilo de vida, que incluyen lo siguiente: ? [employer] y las encas a diario. ? Realizar actividad fsica con regularidad. ? Tener una dieta saludable. ? Evitar el consumo de tabaco y drogas. ? Limitar el consumo de bebidas alcohlicas. ? Marine scientist. ? Tomar una dosis baja de aspirina a diario a partir de los 50 aos de [place]. Qu sucede durante un control de bienestar anual? Los servicios y exmenes de deteccin realizados por su mdico durante el control de bienestar anual dependern de su salud general, factores de riesgo de estilo de vida y los antecedentes familiares de enfermedades. Asesoramiento Su mdico puede preguntarle acerca de:  Consumo de alcohol.  Consumo de tabaco.  Consumo de drogas.  Bienestar emocional.  Bienestar en el hogar y las relaciones  personales.  Actividad sexual.  Hbitos de alimentacin.  Trabajo y [place] laboral.  Pruebas de deteccin Pueden hacerle las siguientes pruebas o mediciones:  Diplomatic Services operational officer, peso e ndice de masa muscular [facility]).  Presin arterial.  Niveles de lpidos y colesterol. Estos se pueden verificar cada 5 aos o, con ms frecuencia, si usted tiene ms de 50 aos de edad.  Control de la piel.  Pruebas de deteccin de cncer de pulmn. Es posible que se le realice esta prueba de deteccin a partir de los 55 aos de edad, si ha fumado durante 30 aos un paquete diario y sigue fumando o dej el hbito en algn momento en los ltimos 15 aos.  Prueba de Public house manager en las heces [facility]). Es posible que se le realice esta prueba todos los aos a partir de los 50 aos de [place].  Sigmoidoscopa o colonoscopa flexible. Es posible que se le realice una sigmoidoscopa cada 5 aos o una colonoscopa cada 10 aos a partir de los 50 aos de [place].  Examen de deteccin del cncer de prstata. Las recomendaciones variarn segn sus antecedentes familiares y Civil Service fast streamer.  Anlisis de sangre para la deteccin de la hepatitis C.  Anlisis de sangre para la deteccin de la hepatitis B.  Anlisis de enfermedades de transmisin sexual (ETS).  Pruebas de deteccin de la diabetes. Esto se Physiological scientist un control del azcar en la sangre (glucosa) despus de no haber comido durante un periodo de tiempo (ayuno). Es posible que se le  realice esta prueba cada 1 a 3 tres aos.  Hable con su mdico para Avon Productsanalizar los resultados, las opciones de tratamiento y, si corresponde, la necesidad de Education officer, environmentalrealizar ms pruebas. Vacunas El mdico puede recomendarle que se aplique algunas vacunas, por ejemplo:  Vacuna contra la gripe. Se recomienda aplicarse esta vacuna todos los aos.  Vacuna contra la difteria, ttanos y tos Teacher, early years/preferina acelular (DTPa, DT). Es posible que tenga que aplicarse un refuerzo contra el ttanos y la difteria  (DT) cada 10aos.  Vacuna contra la varicela. Es posible que tenga que aplicrsela si no recibi esta vacuna.  Vacuna contra el herpes zster. Es posible que la necesite despus de los 60 aos de edad.  Vacuna contra el sarampin, rubola y paperas (SRP). Es posible que necesite aplicarse al menos una dosis de la vacuna SRP si naci despus de [phone]. Podra tambin necesitar una segunda dosis.  Vacuna antineumoccica conjugada 13 valente (PCV13). Puede necesitar esta vacuna si tiene determinadas enfermedades y no se vacun anteriormente.  Vacuna antineumoccica de polisacridos (PPSV23). Quizs tenga que aplicarse una o dos dosis si fuma o si sufre determinadas enfermedades.  Vacuna antimeningoccica. Puede necesitar esta vacuna si tiene determinadas afecciones.  Vacuna contra la hepatitis A. Es posible que necesite esta vacuna si tiene ciertas afecciones o si viaja o trabaja en lugares en los que podra estar expuesto a la hepatitis A.  Vacuna contra la hepatitis B. Es posible que necesite esta vacuna si tiene ciertas afecciones o si viaja o trabaja en lugares en los que podra estar expuesto a la hepatitis B.  Vacuna contra antihaemophilus influenzae tipoB (Hib). Es posible que necesite esta vacuna si tiene determinados factores de [place].  Hable con el mdico sobre qu pruebas de deteccin y qu vacunas necesita, y con qu frecuencia las necesita. Esta informacin no tiene Theme park managercomo fin reemplazar el consejo del mdico. Asegrese de hacerle al mdico cualquier pregunta que tenga. Document Released: 08/13/2016 Document Revised: 08/13/2016 Document Reviewed: 01/31/2015 Elsevier Interactive Patient Education  2018 [employer]   IF you received an x-ray today, you will receive an invoice from [facility]. Please contact [facility] at [phone] with questions or concerns regarding your invoice.   IF you received labwork today, you will receive an invoice from [facility].  Please contact [facility] at [phone] with questions or concerns regarding your invoice.   Our billing staff will not be able to assist you with questions regarding bills from these companies.  You will be contacted with the lab results as soon as they are available. The fastest way to get your results is to activate your My Chart account. Instructions are located on the last page of this paperwork. If you have not heard from us regarding the results in 2 weeks, please contact this office.

## 2018-03-13 NOTE — Progress Notes (Signed)
11/29/20198:16 AM  Edward Vasquez 08/03/1965, 52 y.o. male [id number]  Chief Complaint  Patient presents with  . Annual Exam  . Hypertension    needs refill on Norvasc, Lipitor, flexeril, Lisinopril 3 month supplies    HPI:   Patient is a 52 y.o. male with past medical history significant for HTN, HLP, prediabetes who presents today for CPE  Last CPE Dec 2018 Colorectal Cancer Screening: ordering cologuard today Prostate Cancer Screening: doing today HIV Screening: 2016 Seasonal Influenza Vaccination: today Td/Tdap Vaccination: 2016 Pneumococcal Vaccination: at age 4 Zoster Vaccination: reports he was immunized against varicella in [place] Frequency of Dental evaluation: Q6 months Frequency of Eye evaluation: yearly  Reports he ran out of his lisinopril several weeks ago  Lab Results  Component Value Date   HGBA1C 5.7 (H) 09/10/2017   HGBA1C 5.9 (H) 04/02/2017   HGBA1C 5.7 (H) 08/13/2016   Lab Results  Component Value Date   LDLCALC 94 09/10/2017   CREATININE 0.93 09/10/2017     Fall Risk  03/13/2018 09/10/2017 04/02/2017 08/13/2016 02/08/2016  Falls in the past year? 0 No No No No     Depression screen [facility] 2/9 03/13/2018 09/10/2017 04/02/2017  Decreased Interest 0 0 0  Down, Depressed, Hopeless 0 0 0  PHQ - 2 Score 0 0 0    Not on File  Prior to Admission medications   Medication Sig Start Date End Date Taking? Authorizing Provider  amLODipine (NORVASC) 10 MG tablet Take 1 tablet (10 mg total) by mouth daily. 09/10/17  Yes Wardell Honour, MD  atorvastatin (LIPITOR) 20 MG tablet Take 1 tablet (20 mg total) by mouth daily. 09/10/17  Yes Wardell Honour, MD  cyclobenzaprine (FLEXERIL) 10 MG tablet Take 1 tablet (10 mg total) by mouth 3 (three) times daily as needed for muscle spasms. 04/02/17  Yes Wardell Honour, MD  lisinopril (PRINIVIL,ZESTRIL) 5 MG tablet Take 1 tablet (5 mg total) by mouth daily. 09/10/17  Yes Wardell Honour, MD    Past Medical History:   Diagnosis Date  . Glucose intolerance (impaired glucose tolerance)   . Hyperlipidemia   . Hypertension     History reviewed. No pertinent surgical history.  Social History   Tobacco Use  . Smoking status: Never Smoker  . Smokeless tobacco: Never Used  Substance Use Topics  . Alcohol use: Yes    Alcohol/week: 0.0 standard drinks    Comment: RARE BEER    Family History  Problem Relation Age of Onset  . Diabetes Mother   . Hypertension Mother   . Hypertension Father   . Mental illness Sister   . Stroke Brother   . Hypertension Brother   . Hypertension Sister     Review of Systems  Constitutional: Negative for chills and fever.  Respiratory: Negative for cough and shortness of breath.   Cardiovascular: Negative for chest pain, palpitations and leg swelling.  Gastrointestinal: Negative for abdominal pain, nausea and vomiting.  Musculoskeletal: Positive for back pain.     OBJECTIVE:  Blood pressure (!) 145/88, pulse 67, temperature 98.1 F (36.7 C), temperature source Oral, height 5' 4.17" (1.63 m), weight 242 lb (109.8 kg), SpO2 96 %. Body mass index is 41.32 kg/m.   BP Readings from Last 3 Encounters:  03/13/18 (!) 145/88  09/10/17 (!) 148/80  04/02/17 126/78   Wt Readings from Last 3 Encounters:  03/13/18 242 lb (109.8 kg)  09/10/17 235 lb (106.6 kg)  04/02/17 237 lb (107.5 kg)  Visual Acuity Screening   Right eye Left eye Both eyes  Without correction: '20/25 20/25 20/25 '  With correction:       Physical Exam  Constitutional: He is oriented to person, place, and time. He appears well-developed and well-nourished.  HENT:  Head: Normocephalic and atraumatic.  Right Ear: Hearing, tympanic membrane, external ear and ear canal normal.  Left Ear: Hearing, tympanic membrane, external ear and ear canal normal.  Mouth/Throat: Oropharynx is clear and moist. No oropharyngeal exudate.  Eyes: Pupils are equal, round, and reactive to light. Conjunctivae and  EOM are normal.  Neck: Neck supple. No thyromegaly present.  Cardiovascular: Normal rate, regular rhythm, normal heart sounds and intact distal pulses. Exam reveals no gallop and no friction rub.  No murmur heard. Pulmonary/Chest: Effort normal and breath sounds normal. He has no wheezes. He has no rhonchi. He has no rales.  Abdominal: Soft. Bowel sounds are normal. He exhibits no distension and no mass. There is no tenderness.  Musculoskeletal: Normal range of motion. He exhibits no edema.  Lymphadenopathy:    He has no cervical adenopathy.  Neurological: He is alert and oriented to person, place, and time. He has normal strength and normal reflexes. No cranial nerve deficit. Coordination and gait normal.  Skin: Skin is warm and dry.  Psychiatric: He has a normal mood and affect.  Nursing note and vitals reviewed.   ASSESSMENT and PLAN  1. Annual physical exam Routine HCM labs ordered. HCM reviewed/discussed. Anticipatory guidance regarding healthy weight, lifestyle and choices given.   2. Essential hypertension Uncontrolled. wo one of his meds. Recheck nurse BP. Goal < 130/80. Otherwise increase lisinopril. Discussed LFM - TSH - CMP14+EGFR  3. Screening for deficiency anemia - CBC with Differential/Platelet  4. Screening for prostate cancer - PSA  5. Glucose intolerance (impaired glucose tolerance) - Hemoglobin A1c  6. Screening for colon cancer - Cologuard  7. Pure hypercholesterolemia - Lipid panel   flu vaccine given today  Return in about 6 months (around 09/11/2018) for chronic medical conditions.    [name], MD Primary Care at [place],  [address] Ph.  [phone] Fax [phone]

## 2018-03-14 LAB — CBC WITH DIFFERENTIAL/PLATELET
Basophils Absolute: 0.1 10*3/uL (ref 0.0–0.2)
Basos: 1 %
EOS (ABSOLUTE): 0.2 10*3/uL (ref 0.0–0.4)
Eos: 3 %
Hematocrit: 45.6 % (ref 37.5–51.0)
Hemoglobin: 15.1 g/dL (ref 13.0–17.7)
Immature Grans (Abs): 0 10*3/uL (ref 0.0–0.1)
Immature Granulocytes: 0 %
Lymphocytes Absolute: 1.9 10*3/uL (ref 0.7–3.1)
Lymphs: 33 %
MCH: 30.4 pg (ref 26.6–33.0)
MCHC: 33.1 g/dL (ref 31.5–35.7)
MCV: 92 fL (ref 79–97)
Monocytes Absolute: 0.5 10*3/uL (ref 0.1–0.9)
Monocytes: 8 %
Neutrophils Absolute: 3.2 10*3/uL (ref 1.4–7.0)
Neutrophils: 55 %
Platelets: 353 10*3/uL (ref 150–450)
RBC: 4.96 x10E6/uL (ref 4.14–5.80)
RDW: 13 % (ref 12.3–15.4)
WBC: 5.8 10*3/uL (ref 3.4–10.8)

## 2018-03-14 LAB — CMP14+EGFR
ALT: 33 IU/L (ref 0–44)
AST: 20 IU/L (ref 0–40)
Albumin/Globulin Ratio: 1.8 (ref 1.2–2.2)
Albumin: 4.6 g/dL (ref 3.5–5.5)
Alkaline Phosphatase: 68 IU/L (ref 39–117)
BUN/Creatinine Ratio: 13 (ref 9–20)
BUN: 10 mg/dL (ref 6–24)
Bilirubin Total: 0.6 mg/dL (ref 0.0–1.2)
CO2: 24 mmol/L (ref 20–29)
Calcium: 9 mg/dL (ref 8.7–10.2)
Chloride: 102 mmol/L (ref 96–106)
Creatinine, Ser: 0.75 mg/dL — ABNORMAL LOW (ref 0.76–1.27)
GFR calc Af Amer: 124 mL/min/{1.73_m2} (ref >59)
GFR calc non Af Amer: 107 mL/min/{1.73_m2} (ref >59)
Globulin, Total: 2.5 g/dL (ref 1.5–4.5)
Glucose: 89 mg/dL (ref 65–99)
Potassium: 4.5 mmol/L (ref 3.5–5.2)
Sodium: 138 mmol/L (ref 134–144)
Total Protein: 7.1 g/dL (ref 6.0–8.5)

## 2018-03-14 LAB — LIPID PANEL
Chol/HDL Ratio: 3.3 ratio (ref 0.0–5.0)
Cholesterol, Total: 126 mg/dL (ref 100–199)
HDL: 38 mg/dL — ABNORMAL LOW (ref >39)
LDL Calculated: 71 mg/dL (ref 0–99)
Triglycerides: 84 mg/dL (ref 0–149)
VLDL Cholesterol Cal: 17 mg/dL (ref 5–40)

## 2018-03-14 LAB — PSA: Prostate Specific Ag, Serum: 0.3 ng/mL (ref 0.0–4.0)

## 2018-03-14 LAB — HEMOGLOBIN A1C
Est. average glucose Bld gHb Est-mCnc: 120 mg/dL
Hgb A1c MFr Bld: 5.8 % — ABNORMAL HIGH (ref 4.8–5.6)

## 2018-03-14 LAB — TSH: TSH: 2.56 u[IU]/mL (ref 0.450–4.500)

## 2018-03-24 DIAGNOSIS — Z125 Encounter for screening for malignant neoplasm of prostate: Secondary | ICD-10-CM | POA: Diagnosis not present

## 2018-03-28 LAB — COLOGUARD: Cologuard: NEGATIVE

## 2018-08-16 ENCOUNTER — Other Ambulatory Visit: Payer: Self-pay | Admitting: Family Medicine

## 2018-09-06 ENCOUNTER — Other Ambulatory Visit: Payer: Self-pay | Admitting: Family Medicine

## 2018-09-11 ENCOUNTER — Encounter: Payer: Self-pay | Admitting: Family Medicine

## 2018-09-11 ENCOUNTER — Ambulatory Visit (INDEPENDENT_AMBULATORY_CARE_PROVIDER_SITE_OTHER): Payer: BLUE CROSS/BLUE SHIELD | Admitting: Family Medicine

## 2018-09-11 ENCOUNTER — Other Ambulatory Visit: Payer: Self-pay

## 2018-09-11 VITALS — BP 134/83 | HR 74 | Temp 98.3°F | Ht 64.17 in | Wt 241.0 lb

## 2018-09-11 DIAGNOSIS — E7800 Pure hypercholesterolemia, unspecified: Secondary | ICD-10-CM | POA: Diagnosis not present

## 2018-09-11 DIAGNOSIS — R4701 Aphasia: Secondary | ICD-10-CM | POA: Diagnosis not present

## 2018-09-11 DIAGNOSIS — I10 Essential (primary) hypertension: Secondary | ICD-10-CM | POA: Diagnosis not present

## 2018-09-11 DIAGNOSIS — R7302 Impaired glucose tolerance (oral): Secondary | ICD-10-CM | POA: Diagnosis not present

## 2018-09-11 DIAGNOSIS — R42 Dizziness and giddiness: Secondary | ICD-10-CM | POA: Diagnosis not present

## 2018-09-11 LAB — CBC
Hematocrit: 43.9 % (ref 37.5–51.0)
Hemoglobin: 14.5 g/dL (ref 13.0–17.7)
MCH: 31.4 pg (ref 26.6–33.0)
MCHC: 33 g/dL (ref 31.5–35.7)
MCV: 95 fL (ref 79–97)
Platelets: 344 10*3/uL (ref 150–450)
RBC: 4.62 x10E6/uL (ref 4.14–5.80)
RDW: 13.6 % (ref 11.6–15.4)
WBC: 7.1 10*3/uL (ref 3.4–10.8)

## 2018-09-11 LAB — LIPID PANEL

## 2018-09-11 MED ORDER — ATORVASTATIN CALCIUM 20 MG PO TABS: 20.0000 mg | ORAL_TABLET | Freq: Every day | ORAL | 3 refills | Status: DC

## 2018-09-11 MED ORDER — AMLODIPINE BESYLATE 10 MG PO TABS: 10.0000 mg | ORAL_TABLET | Freq: Every day | ORAL | 1 refills | Status: DC

## 2018-09-11 MED ORDER — LISINOPRIL 5 MG PO TABS: 5.0000 mg | ORAL_TABLET | Freq: Every day | ORAL | 1 refills | Status: DC

## 2018-09-11 NOTE — Patient Instructions (Signed)
° ° ° °  If you have lab work done today you will be contacted with your lab results within the next 2 weeks.  If you have not heard from us then please contact us. The fastest way to get your results is to register for My Chart. ° ° °IF you received an x-ray today, you will receive an invoice from [facility]. Please contact [facility] at [phone] with questions or concerns regarding your invoice.  ° °IF you received labwork today, you will receive an invoice from [facility]. Please contact [facility] at [phone] with questions or concerns regarding your invoice.  ° °Our billing staff will not be able to assist you with questions regarding bills from these companies. ° °You will be contacted with the lab results as soon as they are available. The fastest way to get your results is to activate your My Chart account. Instructions are located on the last page of this paperwork. If you have not heard from us regarding the results in 2 weeks, please contact this office. °  ° ° ° °

## 2018-09-11 NOTE — Progress Notes (Signed)
5/29/202010:33 AM  Edward Vasquez [birth year], 53 y.o., male [id number]  Chief Complaint  Patient presents with  . Hypertension    has been experiencing a little dizziness last week  while working under the car, when he went to stand felt dizzy. Has been feeling a little unstable for a few days after that, but not quite the dizzy feeling. Daughter chked bp, bottom number was in the 50's  . Medication Refill    on all medcation    HPI:   Patient is a 53 y.o. male with past medical history significant for HTN, HLP, prediabetes who presents today for followup HTN and new onset dizziness  Last OV Nov 2019  Was working underneath his car 2 weeks ago Got out from under his car and stood up felt very dizzy Checked his BP and remembers DBP to be around 50s Tried to work under the car again later that day but dizziness returned, persisted for several days always in setting of standing up after laying down  Has since then resolved  He denies any associated chest pain, palpitations, SOB, diaphoresis, focal weakness, vision changes He endorses that he felt his speech was affected, he was able to think well but had difficulties getting words out for the remaining of the day, has since resolved  Does not smoke Takes a daily ASA  Lab Results  Component Value Date   HGBA1C 5.8 (H) 03/13/2018   Lab Results  Component Value Date   CREATININE 0.75 (L) 03/13/2018   BUN 10 03/13/2018   NA 138 03/13/2018   K 4.5 03/13/2018   CL 102 03/13/2018   CO2 24 03/13/2018   Lab Results  Component Value Date   CHOL 126 03/13/2018   HDL 38 (L) 03/13/2018   LDLCALC 71 03/13/2018   TRIG 84 03/13/2018   CHOLHDL 3.3 03/13/2018    Fall Risk  09/11/2018 03/13/2018 09/10/2017 04/02/2017 08/13/2016  Falls in the past year? 0 0 No No No  Number falls in past yr: 0 - - - -  Injury with Fall? 0 - - - -     Depression screen [facility] 2/9 09/11/2018 03/13/2018 09/10/2017  Decreased Interest 0 0 0  Down,  Depressed, Hopeless 0 0 0  PHQ - 2 Score 0 0 0    Not on File  Prior to Admission medications   Medication Sig Start Date End Date Taking? Authorizing Provider  amLODipine (NORVASC) 10 MG tablet Take 1 tablet (10 mg total) by mouth daily. 03/13/18  Yes [name], MD  atorvastatin (LIPITOR) 20 MG tablet Take 1 tablet (20 mg total) by mouth daily. 03/13/18  Yes [name], MD  cyclobenzaprine (FLEXERIL) 10 MG tablet Take 1 tablet (10 mg total) by mouth 3 (three) times daily as needed for muscle spasms. 03/13/18  Yes [name], MD  lisinopril (ZESTRIL) 5 MG tablet Take 1 tablet by mouth once daily 09/06/18  Yes [name], MD    Past Medical History:  Diagnosis Date  . Glucose intolerance (impaired glucose tolerance)   . Hyperlipidemia   . Hypertension     History reviewed. No pertinent surgical history.  Social History   Tobacco Use  . Smoking status: Never Smoker  . Smokeless tobacco: Never Used  Substance Use Topics  . Alcohol use: Yes    Alcohol/week: 0.0 standard drinks    Comment: RARE BEER    Family History  Problem Relation Age of Onset  . Diabetes Mother   .  Hypertension Mother   . Hypertension Father   . Mental illness Sister   . Stroke Brother   . Hypertension Brother   . Hypertension Sister     ROS Per hpi  OBJECTIVE:  Today's Vitals   09/11/18 0941 09/11/18 0949 09/11/18 0953 09/11/18 0955  BP:  126/78 124/80 134/83  Pulse:  69 74 74  Temp: 98.3 F (36.8 C)     TempSrc: Oral     SpO2: 98%     Weight: 241 lb (109.3 kg)     Height: 5' 4.17" (1.63 m)      Body mass index is 41.15 kg/m.   Physical Exam Vitals signs and nursing note reviewed.  Constitutional:      Appearance: He is well-developed.  HENT:     Head: Normocephalic and atraumatic.     Right Ear: Hearing, tympanic membrane, ear canal and external ear normal.     Left Ear: Hearing, tympanic membrane, ear canal and external ear normal.     Mouth/Throat:      Mouth: Mucous membranes are moist.     Tongue: Tongue does not deviate from midline.     Pharynx: Uvula midline. No oropharyngeal exudate.  Eyes:     General: No visual field deficit.    Extraocular Movements: Extraocular movements intact.     Conjunctiva/sclera: Conjunctivae normal.     Pupils: Pupils are equal, round, and reactive to light.  Neck:     Musculoskeletal: Neck supple.     Vascular: No carotid bruit.  Cardiovascular:     Rate and Rhythm: Normal rate and regular rhythm.     Heart sounds: No murmur. No friction rub. No gallop.   Pulmonary:     Effort: Pulmonary effort is normal.     Breath sounds: Normal breath sounds. No wheezing or rales.  Lymphadenopathy:     Cervical: No cervical adenopathy.  Skin:    General: Skin is warm and dry.  Neurological:     Mental Status: He is alert and oriented to person, place, and time.     Cranial Nerves: Cranial nerves are intact.     Motor: Motor function is intact. No pronator drift.     Coordination: Coordination is intact. Romberg sign negative. Finger-Nose-Finger Test and Heel to [facility] Test normal. Rapid alternating movements normal.     Gait: Gait is intact.     Deep Tendon Reflexes: Reflexes are normal and symmetric.  Psychiatric:        Mood and Affect: Mood and affect normal.        Speech: Speech normal.    My interpretation of EKG:  NSR, HR 65, normal intervals, nonspecific t wave changes  ASSESSMENT and PLAN  1. 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 ER precautions given. - US Carotid Duplex Bilateral; Future - MR Brain W Wo Contrast; Future - ECHOCARDIOGRAM COMPLETE; Future  2. Dizzy Resolved. Suggestive of vertigo, benign. MRI pending. - Orthostatic vital signs - CBC - EKG 12-Lead  3. Glucose intolerance (impaired glucose tolerance) Checking labs today, medications will be started if needed.  - Hemoglobin A1c  4. Essential hypertension Controlled. Continue  current regime.  - Lipid panel - TSH - CMP14+EGFR  5. Pure hypercholesterolemia Checking labs today, medications will be adjusted as needed.  - Lipid panel - TSH - CMP14+EGFR  Other orders - lisinopril (ZESTRIL) 5 MG tablet; Take 1 tablet (5 mg total) by mouth daily. - atorvastatin (LIPITOR) 20 MG tablet; Take  1 tablet (20 mg total) by mouth daily. - amLODipine (NORVASC) 10 MG tablet; Take 1 tablet (10 mg total) by mouth daily.  Return in about 4 weeks (around 10/09/2018).    [name], MD Primary Care at [place], [place] [address] Ph.  [phone] Fax [phone]

## 2018-09-12 LAB — CMP14+EGFR
ALT: 35 IU/L (ref 0–44)
AST: 23 IU/L (ref 0–40)
Albumin/Globulin Ratio: 2 (ref 1.2–2.2)
Albumin: 4.7 g/dL (ref 4.0–5.0)
Alkaline Phosphatase: 68 IU/L (ref 39–117)
BUN/Creatinine Ratio: 16 (ref 9–20)
BUN: 11 mg/dL (ref 6–24)
Bilirubin Total: 0.9 mg/dL (ref 0.0–1.2)
CO2: 21 mmol/L (ref 20–29)
Calcium: 9.2 mg/dL (ref 8.7–10.2)
Chloride: 104 mmol/L (ref 96–106)
Creatinine, Ser: 0.68 mg/dL — ABNORMAL LOW (ref 0.76–1.27)
GFR calc Af Amer: 129 mL/min/{1.73_m2} (ref >59)
GFR calc non Af Amer: 111 mL/min/{1.73_m2} (ref >59)
Globulin, Total: 2.4 g/dL (ref 1.5–4.5)
Glucose: 90 mg/dL (ref 65–99)
Potassium: 3.9 mmol/L (ref 3.5–5.2)
Sodium: 140 mmol/L (ref 134–144)
Total Protein: 7.1 g/dL (ref 6.0–8.5)

## 2018-09-12 LAB — LIPID PANEL
Chol/HDL Ratio: 3.1 ratio (ref 0.0–5.0)
Cholesterol, Total: 112 mg/dL (ref 100–199)
HDL: 36 mg/dL — ABNORMAL LOW (ref >39)
LDL Calculated: 64 mg/dL (ref 0–99)
Triglycerides: 58 mg/dL (ref 0–149)
VLDL Cholesterol Cal: 12 mg/dL (ref 5–40)

## 2018-09-12 LAB — TSH: TSH: 2.41 u[IU]/mL (ref 0.450–4.500)

## 2018-09-12 LAB — HEMOGLOBIN A1C
Est. average glucose Bld gHb Est-mCnc: 120 mg/dL
Hgb A1c MFr Bld: 5.8 % — ABNORMAL HIGH (ref 4.8–5.6)

## 2018-10-09 ENCOUNTER — Encounter: Payer: Self-pay | Admitting: Family Medicine

## 2018-10-09 ENCOUNTER — Other Ambulatory Visit: Payer: Self-pay

## 2018-10-09 ENCOUNTER — Ambulatory Visit (INDEPENDENT_AMBULATORY_CARE_PROVIDER_SITE_OTHER): Payer: BC Managed Care – PPO | Admitting: Family Medicine

## 2018-10-09 VITALS — BP 130/85 | HR 88 | Temp 98.5°F | Ht 64.17 in | Wt 243.0 lb

## 2018-10-09 DIAGNOSIS — M545 Low back pain, unspecified: Secondary | ICD-10-CM

## 2018-10-09 DIAGNOSIS — R4701 Aphasia: Secondary | ICD-10-CM

## 2018-10-09 DIAGNOSIS — R42 Dizziness and giddiness: Secondary | ICD-10-CM | POA: Diagnosis not present

## 2018-10-09 DIAGNOSIS — I10 Essential (primary) hypertension: Secondary | ICD-10-CM | POA: Diagnosis not present

## 2018-10-09 DIAGNOSIS — M79671 Pain in right foot: Secondary | ICD-10-CM | POA: Diagnosis not present

## 2018-10-09 MED ORDER — METHOCARBAMOL 500 MG PO TABS: 500.0000 mg | ORAL_TABLET | Freq: Three times a day (TID) | ORAL | 0 refills | Status: DC | PRN

## 2018-10-09 NOTE — Patient Instructions (Signed)
° ° ° °  If you have lab work done today you will be contacted with your lab results within the next 2 weeks.  If you have not heard from us then please contact us. The fastest way to get your results is to register for My Chart. ° ° °IF you received an x-ray today, you will receive an invoice from [facility]. Please contact [facility] at [phone] with questions or concerns regarding your invoice.  ° °IF you received labwork today, you will receive an invoice from [facility]. Please contact [facility] at [phone] with questions or concerns regarding your invoice.  ° °Our billing staff will not be able to assist you with questions regarding bills from these companies. ° °You will be contacted with the lab results as soon as they are available. The fastest way to get your results is to activate your My Chart account. Instructions are located on the last page of this paperwork. If you have not heard from us regarding the results in 2 weeks, please contact this office. °  ° ° ° °

## 2018-10-09 NOTE — Progress Notes (Signed)
6/26/202011:59 AM  Edward Vasquez 1966/01/10, 53 y.o., male [id number]  Chief Complaint  Patient presents with  . Hypertension    n medical concerns, take bp at home not every day    HPI:   Patient is a 53 y.o. male with past medical history significant for HTN, HLP, prediabetes who presents today for followup  Last OV may 2020  Concerns for expressive aphasia MRI and US carotid scheduled for October 21 2018  Overall has been feeling better this week Last week was still having dizziness when he lied down and looked back, having some neck pain Has not had anymore speech issues Checking BP when he is dizzy or not feeling well: similar to todays  BP Readings from Last 3 Encounters:  10/09/18 130/85  09/11/18 134/83  03/13/18 (!) 145/88   Having pain right foot pad  Walks all day Denies any callus Denies any numbness or tingling Has been trying to use topical pain reliver  Requesting different muscle relaxant Flexeril not helping with low back muscle spasms  Patient did received lab results via mychart  Depression screen [facility] 2/9 10/09/2018 09/11/2018 03/13/2018  Decreased Interest 0 0 0  Down, Depressed, Hopeless 0 0 0  PHQ - 2 Score 0 0 0    Fall Risk  10/09/2018 09/11/2018 03/13/2018 09/10/2017 04/02/2017  Falls in the past year? 0 0 0 No No  Number falls in past yr: 0 0 - - -  Injury with Fall? 0 0 - - -     No Known Allergies  Prior to Admission medications   Medication Sig Start Date End Date Taking? Authorizing Provider  amLODipine (NORVASC) 10 MG tablet Take 1 tablet (10 mg total) by mouth daily. 09/11/18  Yes [name], MD  atorvastatin (LIPITOR) 20 MG tablet Take 1 tablet (20 mg total) by mouth daily. 09/11/18  Yes [name], MD  cyclobenzaprine (FLEXERIL) 10 MG tablet Take 1 tablet (10 mg total) by mouth 3 (three) times daily as needed for muscle spasms. 03/13/18  Yes [name], MD  lisinopril (ZESTRIL) 5 MG tablet Take 1 tablet (5 mg  total) by mouth daily. 09/11/18  Yes [name], MD    Past Medical History:  Diagnosis Date  . Glucose intolerance (impaired glucose tolerance)   . Hyperlipidemia   . Hypertension     No past surgical history on file.  Social History   Tobacco Use  . Smoking status: Never Smoker  . Smokeless tobacco: Never Used  Substance Use Topics  . Alcohol use: Yes    Alcohol/week: 0.0 standard drinks    Comment: RARE BEER    Family History  Problem Relation Age of Onset  . Diabetes Mother   . Hypertension Mother   . Hypertension Father   . Mental illness Sister   . Stroke Brother   . Hypertension Brother   . Hypertension Sister     Review of Systems  Constitutional: Negative for chills and fever.  Respiratory: Negative for cough and shortness of breath.   Cardiovascular: Negative for chest pain, palpitations and leg swelling.  Gastrointestinal: Negative for abdominal pain, nausea and vomiting.   Per hpi  OBJECTIVE:  Today's Vitals   10/09/18 1103  BP: 130/85  Pulse: 88  Temp: 98.5 F (36.9 C)  TempSrc: Oral  SpO2: 100%  Weight: 243 lb (110.2 kg)  Height: 5' 4.17" (1.63 m)   Body mass index is 41.49 kg/m.   Physical Exam Vitals  signs and nursing note reviewed.  Constitutional:      Appearance: He is well-developed.  HENT:     Head: Normocephalic and atraumatic.  Eyes:     Conjunctiva/sclera: Conjunctivae normal.     Pupils: Pupils are equal, round, and reactive to light.  Neck:     Musculoskeletal: Neck supple.  Cardiovascular:     Rate and Rhythm: Normal rate and regular rhythm.     Heart sounds: No murmur. No friction rub. No gallop.   Pulmonary:     Effort: Pulmonary effort is normal.     Breath sounds: Normal breath sounds. No wheezing or rales.  Skin:    General: Skin is warm and dry.  Neurological:     Mental Status: He is alert and oriented to person, place, and time.     ASSESSMENT and PLAN  1. Right foot pain - Ambulatory  referral to Podiatry  2. Essential hypertension Controlled. Continue current regime.   3. Expressive aphasia No more episodes. Pending studies. RTC precautions reviewed. Consider neuro referral.  4. Dizzy Seems to have resolved. Cont to monitor. Consider neuro referral  5. Acute right-sided low back pain without sciatica Trial of robaxin as flexeril did not help. Discussed supportive measures.  Other orders - methocarbamol (ROBAXIN) 500 MG tablet; Take 1 tablet (500 mg total) by mouth every 8 (eight) hours as needed for muscle spasms.  Return for aftre studies.    Myles LippsIrma M Santiago, MD Primary Care at [facility] [address] [place], [place] [address] Ph.  [phone] Fax [phone]

## 2018-10-21 ENCOUNTER — Other Ambulatory Visit: Payer: Self-pay

## 2018-10-21 ENCOUNTER — Ambulatory Visit
Admission: RE | Admit: 2018-10-21 | Discharge: 2018-10-21 | Disposition: A | Discharge status: Home / Self Care | Payer: BC Managed Care – PPO | Source: Ambulatory Visit | Attending: Family Medicine | Admitting: Family Medicine

## 2018-10-21 ENCOUNTER — Ambulatory Visit (INDEPENDENT_AMBULATORY_CARE_PROVIDER_SITE_OTHER): Payer: BC Managed Care – PPO | Admitting: Podiatry

## 2018-10-21 ENCOUNTER — Ambulatory Visit (INDEPENDENT_AMBULATORY_CARE_PROVIDER_SITE_OTHER): Payer: BC Managed Care – PPO

## 2018-10-21 ENCOUNTER — Encounter: Payer: Self-pay | Admitting: Podiatry

## 2018-10-21 ENCOUNTER — Other Ambulatory Visit: Payer: Self-pay | Admitting: Podiatry

## 2018-10-21 VITALS — BP 138/84 | HR 60 | Temp 98.3°F

## 2018-10-21 DIAGNOSIS — M7751 Other enthesopathy of right foot: Secondary | ICD-10-CM | POA: Diagnosis not present

## 2018-10-21 DIAGNOSIS — R4701 Aphasia: Secondary | ICD-10-CM | POA: Diagnosis not present

## 2018-10-21 DIAGNOSIS — M722 Plantar fascial fibromatosis: Secondary | ICD-10-CM

## 2018-10-21 DIAGNOSIS — M779 Enthesopathy, unspecified: Secondary | ICD-10-CM

## 2018-10-21 MED ORDER — METHYLPREDNISOLONE 4 MG PO TBPK: ORAL_TABLET | ORAL | 0 refills | Status: DC

## 2018-10-21 MED ORDER — MELOXICAM 15 MG PO TABS: 15.0000 mg | ORAL_TABLET | Freq: Every day | ORAL | 1 refills | Status: DC

## 2018-10-23 ENCOUNTER — Other Ambulatory Visit: Payer: Self-pay

## 2018-10-23 ENCOUNTER — Encounter: Payer: Self-pay | Admitting: Family Medicine

## 2018-10-23 ENCOUNTER — Encounter (HOSPITAL_COMMUNITY): Payer: Self-pay | Admitting: Family Medicine

## 2018-10-23 ENCOUNTER — Ambulatory Visit (INDEPENDENT_AMBULATORY_CARE_PROVIDER_SITE_OTHER): Payer: BC Managed Care – PPO | Admitting: Family Medicine

## 2018-10-23 VITALS — BP 133/82 | HR 68 | Temp 98.7°F | Ht 64.17 in | Wt 242.0 lb

## 2018-10-23 DIAGNOSIS — M79601 Pain in right arm: Secondary | ICD-10-CM | POA: Diagnosis not present

## 2018-10-23 DIAGNOSIS — R202 Paresthesia of skin: Secondary | ICD-10-CM

## 2018-10-23 DIAGNOSIS — R252 Cramp and spasm: Secondary | ICD-10-CM

## 2018-10-23 DIAGNOSIS — M79602 Pain in left arm: Secondary | ICD-10-CM

## 2018-10-23 DIAGNOSIS — I10 Essential (primary) hypertension: Secondary | ICD-10-CM | POA: Diagnosis not present

## 2018-10-23 MED ORDER — AMLODIPINE BESYLATE 10 MG PO TABS: 10.0000 mg | ORAL_TABLET | Freq: Every day | ORAL | 1 refills | Status: DC

## 2018-10-23 NOTE — Patient Instructions (Signed)
° ° ° °  If you have lab work done today you will be contacted with your lab results within the next 2 weeks.  If you have not heard from us then please contact us. The fastest way to get your results is to register for My Chart. ° ° °IF you received an x-ray today, you will receive an invoice from [facility]. Please contact [facility] at [phone] with questions or concerns regarding your invoice.  ° °IF you received labwork today, you will receive an invoice from [facility]. Please contact [facility] at [phone] with questions or concerns regarding your invoice.  ° °Our billing staff will not be able to assist you with questions regarding bills from these companies. ° °You will be contacted with the lab results as soon as they are available. The fastest way to get your results is to activate your My Chart account. Instructions are located on the last page of this paperwork. If you have not heard from us regarding the results in 2 weeks, please contact this office. °  ° ° ° °

## 2018-10-23 NOTE — Progress Notes (Signed)
7/10/20205:23 PM  Rennis HardingJose Raul Velez-Espinoza Sep 17, 1965, 53 y.o., male [id number]  Chief Complaint  Patient presents with  . Hypertension    takes fmedication daily, needing refill on norvasc 20mg . Went to ankle and foot doctor today for the inflammation in the foot. Has concerns about the prednisone. Did not do the MRI, became claustrophobic in the machine    HPI:   Patient is a 53 y.o. 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 0 0 0 0 No  Number falls in past yr: 0 0 0 - -  Injury with Fall? 0 0 0 - -     No Known Allergies  Prior to Admission medications   Medication Sig Start Date End Date Taking? Authorizing Provider  amLODipine (NORVASC) 10 MG tablet Take 1 tablet (10 mg total) by mouth daily. 09/11/18  Yes Myles LippsSantiago, Vincentina Sollers M, MD  atorvastatin (LIPITOR) 20 MG tablet Take 1 tablet (20 mg total) by mouth daily. 09/11/18  Yes Myles LippsSantiago, Auren Valdes M, MD  lisinopril (ZESTRIL) 5 MG tablet Take 1 tablet (5 mg total) by mouth  daily. 09/11/18  Yes Myles LippsSantiago, Anmol Fleck M, MD  meloxicam (MOBIC) 15 MG tablet Take 1 tablet (15 mg total) by mouth daily. 10/21/18  Yes Felecia ShellingEvans, Brent M, DPM  methocarbamol (ROBAXIN) 500 MG tablet Take 1 tablet (500 mg total) by mouth every 8 (eight) hours as needed for muscle spasms. 10/09/18  Yes Myles LippsSantiago, Brenya Taulbee M, MD  methylPREDNISolone (MEDROL DOSEPAK) 4 MG TBPK tablet 6 day dose pack - take as directed 10/21/18  Yes Felecia ShellingEvans, Brent M, DPM    Past Medical History:  Diagnosis Date  . Glucose intolerance (impaired glucose tolerance)   . Hyperlipidemia   . Hypertension     History reviewed. No pertinent surgical history.  Social History   Tobacco Use  . Smoking status: Never Smoker  . Smokeless tobacco: Never Used  Substance Use Topics  . Alcohol use: Yes    Alcohol/week: 0.0 standard drinks    Comment: RARE BEER    Family History  Problem Relation Age of Onset  . Diabetes Mother   . Hypertension Mother   . Hypertension Father   . Mental illness Sister   . Stroke Brother   . Hypertension Brother   . Hypertension Sister     Review of Systems  Constitutional: Negative for chills, fever and malaise/fatigue.  Respiratory: Negative for cough and shortness of breath.   Cardiovascular:  Negative for chest pain, palpitations and leg swelling.  Gastrointestinal: Negative for abdominal pain, nausea and vomiting.  Neurological: Positive for dizziness, tingling, speech change and focal weakness.     OBJECTIVE:  Today's Vitals   10/23/18 1658  BP: 133/82  Pulse: 68  Temp: 98.7 F (37.1 C)  TempSrc: Oral  SpO2: 97%  Weight: 242 lb (109.8 kg)  Height: 5' 4.17" (1.63 m)   Body mass index is 41.32 kg/m.   Physical Exam Vitals signs and nursing note reviewed.  Constitutional:      Appearance: He is well-developed.  HENT:     Head: Normocephalic and atraumatic.  Eyes:     Conjunctiva/sclera: Conjunctivae normal.     Pupils: Pupils are equal, round, and reactive to light.  Neck:      Musculoskeletal: Neck supple.  Cardiovascular:     Rate and Rhythm: Normal rate and regular rhythm.     Heart sounds: No murmur. No friction rub. No gallop.   Pulmonary:     Effort: Pulmonary effort is normal.     Breath sounds: Normal breath sounds. No wheezing or rales.  Musculoskeletal:     Comments: Neg BUE tinel and phalen LUE pos elbow flexion test, RUE neg elbow flexion test +2 radial pulses Shoulder, elbow, wrist FROM Strength 5/5  Skin:    General: Skin is warm and dry.  Neurological:     Mental Status: He is alert and oriented to person, place, and time.     ASSESSMENT and PLAN  1. Essential hypertension Controlled. Continue current regime.   2. Paresthesia and pain of both upper extremities Most recent event and exam suggestive of ulnar neuropathy. Discussed supportive measures and use of OTC nsaids as needed prn. Consider ortho referral.  3. Muscle cramps Push fluids.   Other orders - amLODipine (NORVASC) 10 MG tablet; Take 1 tablet (10 mg total) by mouth daily.   Return in about 3 months (around 01/23/2019).    [name], MD Primary Care at [facility], [place] [address] Ph.  [phone] Fax [phone]

## 2018-10-24 ENCOUNTER — Encounter: Payer: Self-pay | Admitting: Family Medicine

## 2018-10-24 MED ORDER — CYCLOBENZAPRINE HCL 10 MG PO TABS: 10.0000 mg | ORAL_TABLET | Freq: Three times a day (TID) | ORAL | 0 refills | Status: DC | PRN

## 2018-10-26 NOTE — Progress Notes (Signed)
   HPI: 53 y.o. male presenting today as a new patient with a chief complaint of burning pain noted to the ball of the right foot as well as the dorsum of the 2nd toe that began two months ago. 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 He has not had any treatment for the pain. Patient is here for further evaluation and treatment.   Past Medical History:  Diagnosis Date  . Glucose intolerance (impaired glucose tolerance)   . Hyperlipidemia   . Hypertension      Physical Exam: General: The patient is alert and oriented x3 in no acute distress.  Dermatology: Skin is warm, dry and supple bilateral lower extremities. Negative for open lesions or macerations.  Vascular: Palpable pedal pulses bilaterally. No edema or erythema noted. Capillary refill within normal limits.  Neurological: Epicritic and protective threshold grossly intact bilaterally.   Musculoskeletal Exam: Pain with palpation noted to the 2nd MPJ of the right foot. Range of motion within normal limits to all pedal and ankle joints bilateral. Muscle strength 5/5 in all groups bilateral.   Radiographic Exam:  Normal osseous mineralization. Joint spaces preserved. No fracture/dislocation/boney destruction.    Assessment: 1. 2nd MPJ capsulitis right    Plan of Care:  1. Patient evaluated. X-Rays reviewed.  2. Injection of 0.5 mLs Celestone Soluspan injected into the 2nd MPJ of the right foot.  3. Prescription for Medrol Dose Pak provided to patient. 4. Prescription for Meloxicam provided to patient. 5. Offloading met pads dispensed.  6. Return to clinic in 4 weeks.   Works Ship broker.      Edrick Kins, DPM [facility]  Dr. Edrick Kins, DPM    [address], [place] [address]                Office [phone]  Fax [phone]

## 2018-11-18 ENCOUNTER — Encounter: Payer: Self-pay | Admitting: Podiatry

## 2018-11-18 ENCOUNTER — Ambulatory Visit (INDEPENDENT_AMBULATORY_CARE_PROVIDER_SITE_OTHER): Payer: BC Managed Care – PPO | Admitting: Podiatry

## 2018-11-18 ENCOUNTER — Other Ambulatory Visit: Payer: Self-pay

## 2018-11-18 VITALS — Temp 97.6°F

## 2018-11-18 DIAGNOSIS — M7751 Other enthesopathy of right foot: Secondary | ICD-10-CM

## 2018-11-21 NOTE — Progress Notes (Signed)
   HPI: 53 y.o. male presenting today for follow up evaluation of right foot pain. He states the pain has improved but not fully resolved. He states the pain is now located on the plantar heel. He states the injection and taking the Meloxicam has helped alleviate his symptoms. He has been using the met pads with no significant relief. Patient is here for further evaluation and treatment.   Past Medical History:  Diagnosis Date  . Glucose intolerance (impaired glucose tolerance)   . Hyperlipidemia   . Hypertension      Physical Exam: General: The patient is alert and oriented x3 in no acute distress.  Dermatology: Skin is warm, dry and supple bilateral lower extremities. Negative for open lesions or macerations.  Vascular: Palpable pedal pulses bilaterally. No edema or erythema noted. Capillary refill within normal limits.  Neurological: Epicritic and protective threshold grossly intact bilaterally.   Musculoskeletal Exam: Pain with palpation noted to the 2nd MPJ of the right foot. Range of motion within normal limits to all pedal and ankle joints bilateral. Muscle strength 5/5 in all groups bilateral.   Assessment: 1. 2nd MPJ capsulitis right - improved    Plan of Care:  1. Patient evaluated.  2. Declined injections.  3. Continue taking Meloxicam as needed.  4. Recommended good shoe gear.  5. Discontinue using met pads - not helping.  6. Return to clinic as needed.   Works Ship broker.      Edrick Kins, DPM [facility]  Dr. Edrick Kins, DPM    [address], [place] [address]                Office [phone]  Fax [phone]

## 2019-01-22 ENCOUNTER — Ambulatory Visit: Payer: BC Managed Care – PPO | Admitting: Family Medicine

## 2019-03-18 ENCOUNTER — Encounter: Payer: Self-pay | Admitting: Family Medicine

## 2019-03-18 ENCOUNTER — Other Ambulatory Visit: Payer: Self-pay

## 2019-03-18 ENCOUNTER — Ambulatory Visit (INDEPENDENT_AMBULATORY_CARE_PROVIDER_SITE_OTHER): Payer: BC Managed Care – PPO | Admitting: Family Medicine

## 2019-03-18 VITALS — BP 134/86 | HR 74 | Temp 98.5°F | Ht 64.74 in | Wt 243.0 lb

## 2019-03-18 DIAGNOSIS — K644 Residual hemorrhoidal skin tags: Secondary | ICD-10-CM

## 2019-03-18 DIAGNOSIS — I10 Essential (primary) hypertension: Secondary | ICD-10-CM | POA: Diagnosis not present

## 2019-03-18 DIAGNOSIS — E7800 Pure hypercholesterolemia, unspecified: Secondary | ICD-10-CM | POA: Diagnosis not present

## 2019-03-18 DIAGNOSIS — Z0001 Encounter for general adult medical examination with abnormal findings: Secondary | ICD-10-CM

## 2019-03-18 DIAGNOSIS — R7302 Impaired glucose tolerance (oral): Secondary | ICD-10-CM

## 2019-03-18 DIAGNOSIS — Z0000 Encounter for general adult medical examination without abnormal findings: Secondary | ICD-10-CM

## 2019-03-18 DIAGNOSIS — Z125 Encounter for screening for malignant neoplasm of prostate: Secondary | ICD-10-CM

## 2019-03-18 MED ORDER — AMLODIPINE BESYLATE 10 MG PO TABS: 10.0000 mg | ORAL_TABLET | Freq: Every day | ORAL | 1 refills | Status: DC

## 2019-03-18 MED ORDER — ATORVASTATIN CALCIUM 20 MG PO TABS: 20.0000 mg | ORAL_TABLET | Freq: Every day | ORAL | 3 refills | Status: DC

## 2019-03-18 MED ORDER — LISINOPRIL 5 MG PO TABS: 5.0000 mg | ORAL_TABLET | Freq: Every day | ORAL | 1 refills | Status: DC

## 2019-03-18 MED ORDER — HYDROCORTISONE ACE-PRAMOXINE 1-1 % EX CREA: 1.0000 "application " | TOPICAL_CREAM | Freq: Two times a day (BID) | CUTANEOUS | 0 refills | Status: DC

## 2019-03-18 NOTE — Progress Notes (Signed)
12/3/202010:30 AM  Edward Vasquez 1965/07/11, 53 y.o., male [id number]  Chief Complaint  Patient presents with  . Annual Exam    thinks he may have a hemorroid, having some bleeding when he uses the restrm    HPI:   Patient is a 53 y.o. male with past medical history significant for HTN, prediabetes, HLP, who presents today for CPE  Last CPE Nov 2019 Colorectal Cancer Screening: cologuard, dec 2019, negative Prostate Cancer Screening: 2019 HIV Screening: 2016 Seasonal Influenza Vaccination: UTD Td/Tdap Vaccination: 2016 Pneumococcal Vaccination: at age 57 Zoster Vaccination: declines Frequency of Dental evaluation: Q6 months, next appt in jan 2021 Frequency of Eye evaluation: yearly, has appt later today  No exam data present   Depression screen [facility] 2/9 03/18/2019 10/23/2018 10/09/2018  Decreased Interest 0 0 0  Down, Depressed, Hopeless 0 0 0  PHQ - 2 Score 0 0 0    Fall Risk  03/18/2019 10/23/2018 10/09/2018 09/11/2018 03/13/2018  Falls in the past year? 0 0 0 0 0  Number falls in past yr: 0 0 0 0 -  Injury with Fall? 0 0 0 0 -     No Known Allergies  Prior to Admission medications   Medication Sig Start Date End Date Taking? Authorizing Provider  amLODipine (NORVASC) 10 MG tablet Take 1 tablet (10 mg total) by mouth daily. 10/23/18  Yes Myles Lipps, MD  atorvastatin (LIPITOR) 20 MG tablet Take 1 tablet (20 mg total) by mouth daily. 09/11/18  Yes Myles Lipps, MD  cyclobenzaprine (FLEXERIL) 10 MG tablet Take 1 tablet (10 mg total) by mouth 3 (three) times daily as needed for muscle spasms. 10/24/18  Yes Myles Lipps, MD  lisinopril (ZESTRIL) 5 MG tablet Take 1 tablet (5 mg total) by mouth daily. 09/11/18  Yes Myles Lipps, MD  meloxicam (MOBIC) 15 MG tablet Take 1 tablet (15 mg total) by mouth daily. 10/21/18  Yes Felecia Shelling, DPM  methylPREDNISolone (MEDROL DOSEPAK) 4 MG TBPK tablet 6 day dose pack - take as directed 10/21/18  Yes Felecia Shelling, DPM    Past Medical History:  Diagnosis Date  . Glucose intolerance (impaired glucose tolerance)   . Hyperlipidemia   . Hypertension     History reviewed. No pertinent surgical history.  Social History   Tobacco Use  . Smoking status: Never Smoker  . Smokeless tobacco: Never Used  Substance Use Topics  . Alcohol use: Yes    Alcohol/week: 0.0 standard drinks    Comment: RARE BEER    Family History  Problem Relation Age of Onset  . Diabetes Mother   . Hypertension Mother   . Hypertension Father   . Mental illness Sister   . Stroke Brother   . Hypertension Brother   . Hypertension Sister     Review of Systems  Constitutional: Negative for chills and fever.  Respiratory: Negative for cough and shortness of breath.   Cardiovascular: Negative for chest pain, palpitations and leg swelling.  Gastrointestinal: Positive for blood in stool (scant, has hemorrhoid flare up). Negative for abdominal pain, constipation, diarrhea, melena, nausea and vomiting.  All other systems reviewed and are negative.    OBJECTIVE:  Today's Vitals   03/18/19 1025  BP: 134/86  Pulse: 74  Temp: 98.5 F (36.9 C)  SpO2: 97%  Weight: 243 lb (110.2 kg)  Height: 5' 4.74" (1.644 m)   Body mass index is 40.76 kg/m.  Wt Readings from Last 3 Encounters:  03/18/19 243 lb (110.2 kg)  10/23/18 242 lb (109.8 kg)  10/09/18 243 lb (110.2 kg)    Physical Exam Vitals signs and nursing note reviewed. Exam conducted with a chaperone present.  Constitutional:      Appearance: He is well-developed.  HENT:     Head: Normocephalic and atraumatic.     Right Ear: Hearing, tympanic membrane, ear canal and external ear normal.     Left Ear: Hearing, tympanic membrane, ear canal and external ear normal.     Mouth/Throat:     Pharynx: No oropharyngeal exudate.  Eyes:     Conjunctiva/sclera: Conjunctivae normal.     Pupils: Pupils are equal, round, and reactive to light.  Neck:     Musculoskeletal:  Neck supple.     Thyroid: No thyromegaly.  Cardiovascular:     Rate and Rhythm: Normal rate and regular rhythm.     Heart sounds: Normal heart sounds. No murmur. No friction rub. No gallop.   Pulmonary:     Effort: Pulmonary effort is normal.     Breath sounds: Normal breath sounds. No wheezing, rhonchi or rales.  Abdominal:     General: Bowel sounds are normal. There is no distension.     Palpations: Abdomen is soft. There is no mass.     Tenderness: There is no abdominal tenderness.  Genitourinary:    Rectum: External hemorrhoid present. No tenderness or anal fissure.  Musculoskeletal: Normal range of motion.     Right lower leg: No edema.     Left lower leg: No edema.  Lymphadenopathy:     Cervical: No cervical adenopathy.  Skin:    General: Skin is warm and dry.  Neurological:     Mental Status: He is alert and oriented to person, place, and time.     Cranial Nerves: No cranial nerve deficit.     Coordination: Coordination normal.     Gait: Gait normal.     Deep Tendon Reflexes: Reflexes are normal and symmetric.  Psychiatric:        Mood and Affect: Mood normal.        Behavior: Behavior normal.     No results found for this or any previous visit (from the past 24 hour(s)).  No results found.   ASSESSMENT and PLAN  1. Annual physical exam Routine HCM labs ordered. HCM reviewed/discussed. Anticipatory guidance regarding healthy weight, lifestyle and choices given.   2. Essential hypertension Controlled. Continue current regime.  - Comprehensive metabolic panel  3. Pure hypercholesterolemia Checking labs today, medications will be adjusted as needed.  - Lipid panel  4. Glucose intolerance (impaired glucose tolerance) - Hemoglobin A1c  5. External hemorrhoids Discussed supportive measurers, proctocream rx.   6. Screening for prostate cancer - PSA  Other orders - amLODipine (NORVASC) 10 MG tablet; Take 1 tablet (10 mg total) by mouth daily. -  atorvastatin (LIPITOR) 20 MG tablet; Take 1 tablet (20 mg total) by mouth daily. - lisinopril (ZESTRIL) 5 MG tablet; Take 1 tablet (5 mg total) by mouth daily. - pramoxine-hydrocortisone (PROCTOCREAM-HC) 1-1 % rectal cream; Place 1 application rectally 2 (two) times daily.  Return in about 6 months (around 09/16/2019).    [name], MD Primary Care at [place], [place] [address] Ph.  [phone] Fax [phone]

## 2019-03-18 NOTE — Patient Instructions (Addendum)
   If you have lab work done today you will be contacted with your lab results within the next 2 weeks.  If you have not heard from us then please contact us. The fastest way to get your results is to register for My Chart.   IF you received an x-ray today, you will receive an invoice from [facility]. Please contact [facility] at [phone] with questions or concerns regarding your invoice.   IF you received labwork today, you will receive an invoice from [facility]. Please contact [facility] at [phone] with questions or concerns regarding your invoice.   Our billing staff will not be able to assist you with questions regarding bills from these companies.  You will be contacted with the lab results as soon as they are available. The fastest way to get your results is to activate your My Chart account. Instructions are located on the last page of this paperwork. If you have not heard from us regarding the results in 2 weeks, please contact this office.     Preventive Care 40-64 Years Old, Male Preventive care refers to lifestyle choices and visits with your health care provider that can promote health and wellness. This includes:  A yearly physical exam. This is also called an annual well check.  Regular dental and eye exams.  Immunizations.  Screening for certain conditions.  Healthy lifestyle choices, such as eating a healthy diet, getting regular exercise, not using drugs or products that contain nicotine and tobacco, and limiting alcohol use. What can I expect for my preventive care visit? Physical exam Your health care provider will check:  Height and weight. These may be used to calculate body mass index (BMI), which is a measurement that tells if you are at a healthy weight.  Heart rate and blood pressure.  Your skin for abnormal spots. Counseling Your health care provider may ask you questions about:  Alcohol, tobacco, and drug use.  Emotional  well-being.  Home and relationship well-being.  Sexual activity.  Eating habits.  Work and work environment. What immunizations do I need?  Influenza (flu) vaccine  This is recommended every year. Tetanus, diphtheria, and pertussis (Tdap) vaccine  You may need a Td booster every 10 years. Varicella (chickenpox) vaccine  You may need this vaccine if you have not already been vaccinated. Zoster (shingles) vaccine  You may need this after age 60. Measles, mumps, and rubella (MMR) vaccine  You may need at least one dose of MMR if you were born in 1957 or later. You may also need a second dose. Pneumococcal conjugate (PCV13) vaccine  You may need this if you have certain conditions and were not previously vaccinated. Pneumococcal polysaccharide (PPSV23) vaccine  You may need one or two doses if you smoke cigarettes or if you have certain conditions. Meningococcal conjugate (MenACWY) vaccine  You may need this if you have certain conditions. Hepatitis A vaccine  You may need this if you have certain conditions or if you travel or work in places where you may be exposed to hepatitis A. Hepatitis B vaccine  You may need this if you have certain conditions or if you travel or work in places where you may be exposed to hepatitis B. Haemophilus influenzae type b (Hib) vaccine  You may need this if you have certain risk factors. Human papillomavirus (HPV) vaccine  If recommended by your health care provider, you may need three doses over 6 months. You may receive vaccines as individual doses   or as more than one vaccine together in one shot (combination vaccines). Talk with your health care provider about the risks and benefits of combination vaccines. What tests do I need? Blood tests  Lipid and cholesterol levels. These may be checked every 5 years, or more frequently if you are over 50 years old.  Hepatitis C test.  Hepatitis B test. Screening  Lung cancer screening.  You may have this screening every year starting at age 55 if you have a 30-pack-year history of smoking and currently smoke or have quit within the past 15 years.  Prostate cancer screening. Recommendations will vary depending on your family history and other risks.  Colorectal cancer screening. All adults should have this screening starting at age 50 and continuing until age 75. Your health care provider may recommend screening at age 45 if you are at increased risk. You will have tests every 1-10 years, depending on your results and the type of screening test.  Diabetes screening. This is done by checking your blood sugar (glucose) after you have not eaten for a while (fasting). You may have this done every 1-3 years.  Sexually transmitted disease (STD) testing. Follow these instructions at home: Eating and drinking  Eat a diet that includes fresh fruits and vegetables, whole grains, lean protein, and low-fat dairy products.  Take vitamin and mineral supplements as recommended by your health care provider.  Do not drink alcohol if your health care provider tells you not to drink.  If you drink alcohol: ? Limit how much you have to 0-2 drinks a day. ? Be aware of how much alcohol is in your drink. In the [place] one drink equals one 12 oz bottle of beer (355 mL), one 5 oz glass of wine (148 mL), or one 1 oz glass of hard liquor (44 mL). Lifestyle  Take daily care of your teeth and gums.  Stay active. Exercise for at least 30 minutes on 5 or more days each week.  Do not use any products that contain nicotine or tobacco, such as cigarettes, e-cigarettes, and chewing tobacco. If you need help quitting, ask your health care provider.  If you are sexually active, practice safe sex. Use a condom or other form of protection to prevent STIs (sexually transmitted infections).  Talk with your health care provider about taking a low-dose aspirin every day starting at age 50. What's next?  Go  to your health care provider once a year for a well check visit.  Ask your health care provider how often you should have your eyes and teeth checked.  Stay up to date on all vaccines. This information is not intended to replace advice given to you by your health care provider. Make sure you discuss any questions you have with your health care provider. Document Released: 04/28/2015 Document Revised: 03/26/2018 Document Reviewed: 03/26/2018 Elsevier Patient Education  2020 Elsevier Inc.  

## 2019-03-19 LAB — COMPREHENSIVE METABOLIC PANEL
ALT: 39 IU/L (ref 0–44)
AST: 24 IU/L (ref 0–40)
Albumin/Globulin Ratio: 1.8 (ref 1.2–2.2)
Albumin: 4.6 g/dL (ref 3.8–4.9)
Alkaline Phosphatase: 71 IU/L (ref 39–117)
BUN/Creatinine Ratio: 18 (ref 9–20)
BUN: 13 mg/dL (ref 6–24)
Bilirubin Total: 0.6 mg/dL (ref 0.0–1.2)
CO2: 21 mmol/L (ref 20–29)
Calcium: 9.5 mg/dL (ref 8.7–10.2)
Chloride: 106 mmol/L (ref 96–106)
Creatinine, Ser: 0.71 mg/dL — ABNORMAL LOW (ref 0.76–1.27)
GFR calc Af Amer: 126 mL/min/{1.73_m2} (ref >59)
GFR calc non Af Amer: 109 mL/min/{1.73_m2} (ref >59)
Globulin, Total: 2.6 g/dL (ref 1.5–4.5)
Glucose: 91 mg/dL (ref 65–99)
Potassium: 4.3 mmol/L (ref 3.5–5.2)
Sodium: 142 mmol/L (ref 134–144)
Total Protein: 7.2 g/dL (ref 6.0–8.5)

## 2019-03-19 LAB — HEMOGLOBIN A1C
Est. average glucose Bld gHb Est-mCnc: 120 mg/dL
Hgb A1c MFr Bld: 5.8 % — ABNORMAL HIGH (ref 4.8–5.6)

## 2019-03-19 LAB — LIPID PANEL
Chol/HDL Ratio: 3.2 ratio (ref 0.0–5.0)
Cholesterol, Total: 125 mg/dL (ref 100–199)
HDL: 39 mg/dL — ABNORMAL LOW (ref >39)
LDL Chol Calc (NIH): 72 mg/dL (ref 0–99)
Triglycerides: 70 mg/dL (ref 0–149)
VLDL Cholesterol Cal: 14 mg/dL (ref 5–40)

## 2019-03-19 LAB — PSA: Prostate Specific Ag, Serum: 0.3 ng/mL (ref 0.0–4.0)

## 2019-09-17 ENCOUNTER — Encounter: Payer: Self-pay | Admitting: Family Medicine

## 2019-09-17 ENCOUNTER — Telehealth: Payer: Self-pay

## 2019-09-17 ENCOUNTER — Ambulatory Visit (INDEPENDENT_AMBULATORY_CARE_PROVIDER_SITE_OTHER): Payer: BC Managed Care – PPO | Admitting: Family Medicine

## 2019-09-17 ENCOUNTER — Other Ambulatory Visit: Payer: Self-pay

## 2019-09-17 VITALS — BP 134/81 | HR 63 | Temp 98.7°F | Ht 64.74 in | Wt 245.0 lb

## 2019-09-17 DIAGNOSIS — I10 Essential (primary) hypertension: Secondary | ICD-10-CM

## 2019-09-17 DIAGNOSIS — R7302 Impaired glucose tolerance (oral): Secondary | ICD-10-CM

## 2019-09-17 DIAGNOSIS — E7800 Pure hypercholesterolemia, unspecified: Secondary | ICD-10-CM

## 2019-09-17 MED ORDER — ATORVASTATIN CALCIUM 20 MG PO TABS: 20.0000 mg | ORAL_TABLET | Freq: Every day | ORAL | 3 refills | Status: DC

## 2019-09-17 MED ORDER — AMLODIPINE BESYLATE 10 MG PO TABS: 10.0000 mg | ORAL_TABLET | Freq: Every day | ORAL | 1 refills | Status: DC

## 2019-09-17 MED ORDER — LISINOPRIL 5 MG PO TABS: 5.0000 mg | ORAL_TABLET | Freq: Every day | ORAL | 1 refills | Status: DC

## 2019-09-17 NOTE — Patient Instructions (Addendum)
   If you have lab work done today you will be contacted with your lab results within the next 2 weeks.  If you have not heard from us then please contact us. The fastest way to get your results is to register for My Chart.   IF you received an x-ray today, you will receive an invoice from [facility]. Please contact [facility] at [phone] with questions or concerns regarding your invoice.   IF you received labwork today, you will receive an invoice from [facility]. Please contact [facility] at [phone] with questions or concerns regarding your invoice.   Our billing staff will not be able to assist you with questions regarding bills from these companies.  You will be contacted with the lab results as soon as they are available. The fastest way to get your results is to activate your My Chart account. Instructions are located on the last page of this paperwork. If you have not heard from us regarding the results in 2 weeks, please contact this office.     Hacer ejercicio para bajar de peso Exercising to Lose Weight El ejercicio es la actividad fsica estructurada y repetitiva que se realiza para mejorar el estado fsico y la salud. Hacer ejercicio de forma regular es importante para todos. Es especialmente importante si tiene sobrepeso. El sobrepeso aumenta el riesgo de tener enfermedad cardaca, accidente cerebrovascular, diabetes, presin arterial alta y varios tipos de cncer. Reducir la ingesta de caloras y hacer ejercicio pueden ayudarlo a bajar de peso. El ejercicio por lo general se clasifica como de intensidad moderada o vigorosa. Para bajar de peso, la mayora de las personas debe hacer una determinada cantidad de ejercicio de intensidad moderada o vigorosa cada semana. Ejercicio de intensidad moderada  El ejercicio de intensidad moderada es cualquier actividad que lo haga moverse lo suficiente como para quemar al menos tres veces ms energa (caloras) que si  estuviera sentado. Algunos ejemplos de ejercicio de intensidad moderada son:  Caminar una milla (1,6 kilmetros) en 15 minutos.  Hacer trabajos de jardinera livianos.  Andar en bicicleta a un ritmo fcil de aguantar. La mayora de las personas debe hacer al menos 150 minutos (2 horas y 30 minutos) por semana de ejercicio de intensidad moderada para mantener su peso corporal. Ejercicio de intensidad vigorosa El ejercicio de intensidad vigorosa es cualquier actividad que lo haga moverse lo suficiente como para quemar al menos seis veces ms caloras que si estuviera sentado. Al hacer ejercicio a esta intensidad, su nivel de esfuerzo debera ser lo suficientemente alto como para no permitirle mantener una conversacin. Algunos ejemplos de ejercicio de intensidad vigorosa son:  Correr.  Practicar un deporte de equipo, como ftbol americano, baloncesto y ftbol.  Saltar la cuerda. La mayora de las personas debe hacer al menos 75 minutos (1 hora y 15 minutos) por semana de ejercicio de intensidad vigorosa para mantener su peso corporal. Cmo puede afectarme el ejercicio? Cuando hace suficiente ejercicio como para quemar ms caloras que las que consume, pierde peso. Tambin reduce la grasa corporal y aumenta la masa muscular. Cuanto ms msculo tenga, mayor cantidad de caloras quemar. El ejercicio tambin:  Mejora el estado de nimo.  Reduce el estrs y las tensiones.  Mejora el estado fsico general, la flexibilidad y la resistencia.  Aumenta la fuerza sea. La cantidad de ejercicio que necesita realizar para bajar de peso depende de:  Su edad.  El tipo de ejercicio.  Cualquier afeccin de salud que tenga.  Su   capacidad fsica general. Pregntele al mdico cunto ejercicio debe realizar y qu tipos de actividades son seguras para usted. Qu medidas puedo tomar para bajar de peso? Nutricin   Haga cambios en la dieta como se lo haya indicado el mdico o especialista en  alimentacin y nutricin (nutricionista). Esto puede incluir lo siguiente: ? Consumir menos caloras. ? Consumir ms protenas. ? Consumir menos grasas no saludables. ? Seguir una dieta que incluya frutas y verduras frescas, cereales integrales, productos lcteos semidescremados y protenas magras. ? Evite los alimentos con grasa, sal y azcar agregadas.  Beba gran cantidad de agua mientras hace ejercicio para evitar la deshidratacin o los golpes de calor. Actividad  Elija una actividad que disfrute y establezca objetivos realistas. El mdico puede ayudarlo a elaborar un plan de ejercicio que funcione para usted.  Haga ejercicio a una intensidad moderada o vigorosa la mayora de los das de la semana. ? La intensidad de la actividad fsica puede variar de una persona a otra. Puede saber qu tan intensa una rutina de ejercicios es para usted al prestar atencin a su respiracin y latidos cardacos. La mayora de las personas notar que su respiracin y latidos cardacos se aceleran al realizar ejercicio de mayor intensidad.  Haga entrenamiento de resistencia dos veces por semana, como: ? Flexiones de brazos. ? Abdominales. ? Levantamiento de pesas. ? Uso de bandas elsticas de resistencia.  Hacer ejercicio en perodos cortos de tiempo puede ser tan til como los perodos largos y estructurados de ejercicio. Si tiene dificultad para encontrar tiempo para hacer ejercicio, trate de incluir el ejercicio en su rutina diaria. ? Levntese, estrese y camine cada 30minutos a lo largo del da. ? Vaya a caminar durante su hora de almuerzo. ? Estacione el auto lejos de su lugar de destino. ? Si usa transporte pblico, bjese una parada antes y camine el resto del camino. ? Pngase de pie y camine cada vez que hable por telfono. ? Utilice la escalera en lugar del ascensor o la escalera mecnica.  Use ropa cmoda y calzado con buen soporte.  No haga ejercicio en exceso que pudiera hacer que se  lastime, se sienta mareado o tenga dificultad para respirar. Dnde buscar ms informacin  Departamento de Salud y Servicios Humanos de los [place] Un[place] (U.S. Department of Health and Human Services): www.hhs.gov  Centros para el Control y la Prevencin de Enfermedades (Centers for Disease Control and Prevention, CDC): www.cdc.gov Comunquese con un mdico:  Antes de comenzar un nuevo programa de ejercicios.  Si tiene preguntas o inquietudes acerca de su peso.  Si tiene un problema mdico que le impide hacer ejercicio. Obtenga ayuda de inmediato si presenta alguno de los siguientes problemas al hacer ejercicio:  Lesiones.  Mareos.  Dificultad para respirar o falta de aire que no desaparecen al dejar de hacer ejercicio.  Dolor en el pecho.  Latidos cardacos rpidos. Resumen  El sobrepeso aumenta el riesgo de tener enfermedad cardaca, accidente cerebrovascular, diabetes, presin arterial alta y varios tipos de cncer.  Para perder peso debe quemar ms caloras que las que consume.  Reducir la cantidad de caloras que consume, adems de hacer ejercicio de intensidad moderada o vigorosa todas las semanas, ayuda a perder peso. Esta informacin no tiene como fin reemplazar el consejo del mdico. Asegrese de hacerle al mdico cualquier pregunta que tenga. Document Revised: 05/30/2017 Document Reviewed: 05/30/2017 Elsevier Patient Education  2020 Elsevier Inc.  

## 2019-09-17 NOTE — Progress Notes (Signed)
6/4/20218:18 AM  Edward Vasquez 1965-10-01, 54 y.o., male [id number]  Chief Complaint  Patient presents with  . Hypertension  . Hyperlipidemia    HPI:   Patient is a 54 y.o. male with past medical history significant for HTN, HLP, prediabetes who presents today for routine followup  Last OV dec 2020  He reports he is overall doing well He has no acute concerns today He is not exercising nor following a diet He has slowly been gaining weight Taking his meds as prescribed Has completed his covid vaccines Will be traveling out of country for about 3 months  Lab Results  Component Value Date   HGBA1C 5.8 (H) 03/18/2019   HGBA1C 5.8 (H) 09/11/2018   HGBA1C 5.8 (H) 03/13/2018   Lab Results  Component Value Date   LDLCALC 72 03/18/2019   CREATININE 0.71 (L) 03/18/2019   Wt Readings from Last 3 Encounters:  09/17/19 245 lb (111.1 kg)  03/18/19 243 lb (110.2 kg)  10/23/18 242 lb (109.8 kg)   BP Readings from Last 3 Encounters:  09/17/19 134/81  03/18/19 134/86  10/23/18 133/82    Depression screen PHQ 2/9 09/17/2019 03/18/2019 10/23/2018  Decreased Interest 0 0 0  Down, Depressed, Hopeless 0 0 0  PHQ - 2 Score 0 0 0    Fall Risk  09/17/2019 03/18/2019 10/23/2018 10/09/2018 09/11/2018  Falls in the past year? 0 0 0 0 0  Number falls in past yr: 0 0 0 0 0  Injury with Fall? 0 0 0 0 0     No Known Allergies  Prior to Admission medications   Medication Sig Start Date End Date Taking? Authorizing Provider  amLODipine (NORVASC) 10 MG tablet Take 1 tablet (10 mg total) by mouth daily. 03/18/19  Yes [name], MD  atorvastatin (LIPITOR) 20 MG tablet Take 1 tablet (20 mg total) by mouth daily. 03/18/19  Yes [name], MD  cyclobenzaprine (FLEXERIL) 10 MG tablet Take 1 tablet (10 mg total) by mouth 3 (three) times daily as needed for muscle spasms. 10/24/18  Yes [name], MD  lisinopril (ZESTRIL) 5 MG tablet Take 1 tablet (5 mg total) by mouth  daily. 03/18/19  Yes [name], MD  meloxicam (MOBIC) 15 MG tablet Take 1 tablet (15 mg total) by mouth daily. 10/21/18  Yes Edrick Kins, DPM  pramoxine-hydrocortisone (PROCTOCREAM-HC) 1-1 % rectal cream Place 1 application rectally 2 (two) times daily. 03/18/19  Yes [name], MD    Past Medical History:  Diagnosis Date  . Glucose intolerance (impaired glucose tolerance)   . Hyperlipidemia   . Hypertension     No past surgical history on file.  Social History   Tobacco Use  . Smoking status: Never Smoker  . Smokeless tobacco: Never Used  Substance Use Topics  . Alcohol use: Yes    Alcohol/week: 0.0 standard drinks    Comment: [employer]    Family History  Problem Relation Age of Onset  . Diabetes Mother   . Hypertension Mother   . Hypertension Father   . Mental illness Sister   . Stroke Brother   . Hypertension Brother   . Hypertension Sister     Review of Systems  Constitutional: Negative for chills and fever.  Respiratory: Negative for cough and shortness of breath.   Cardiovascular: Negative for chest pain, palpitations and leg swelling.  Gastrointestinal: Negative for abdominal pain, nausea and vomiting.     OBJECTIVE:  Today's  Vitals   09/17/19 0809  BP: 134/81  Pulse: 63  Temp: 98.7 F (37.1 C)  SpO2: 97%  Weight: 245 lb (111.1 kg)  Height: 5' 4.74" (1.644 m)   Body mass index is 41.1 kg/m.   Physical Exam Vitals and nursing note reviewed.  Constitutional:      Appearance: He is well-developed.  HENT:     Head: Normocephalic and atraumatic.  Eyes:     Conjunctiva/sclera: Conjunctivae normal.     Pupils: Pupils are equal, round, and reactive to light.  Cardiovascular:     Rate and Rhythm: Normal rate and regular rhythm.     Heart sounds: No murmur. No friction rub. No gallop.   Pulmonary:     Effort: Pulmonary effort is normal.     Breath sounds: Normal breath sounds. No wheezing or rales.  Musculoskeletal:     Cervical  back: Neck supple.  Skin:    General: Skin is warm and dry.  Neurological:     Mental Status: He is alert and oriented to person, place, and time.     No results found for this or any previous visit (from the past 24 hour(s)).  No results found.   ASSESSMENT and PLAN  1. Essential hypertension Controlled. Continue current regime.  - Lipid panel - CMP14+EGFR - amLODipine (NORVASC) 10 MG tablet; Take 1 tablet (10 mg total) by mouth daily. - lisinopril (ZESTRIL) 5 MG tablet; Take 1 tablet (5 mg total) by mouth daily.  2. Pure hypercholesterolemia Checking labs today, medications will be adjusted as needed.  - Lipid panel - CMP14+EGFR - atorvastatin (LIPITOR) 20 MG tablet; Take 1 tablet (20 mg total) by mouth daily.  3. Glucose intolerance (impaired glucose tolerance) Slow weight gain, discussed LFM - Hemoglobin A1c  Return in about 6 months (around 03/18/2020).    [name], MD [facility] [place], [place] [address] Ph.  [phone] Fax [phone]

## 2019-09-18 LAB — HEMOGLOBIN A1C
Est. average glucose Bld gHb Est-mCnc: 117 mg/dL
Hgb A1c MFr Bld: 5.7 % — ABNORMAL HIGH (ref 4.8–5.6)

## 2019-09-18 LAB — LIPID PANEL
Chol/HDL Ratio: 3.4 ratio (ref 0.0–5.0)
Cholesterol, Total: 123 mg/dL (ref 100–199)
HDL: 36 mg/dL — ABNORMAL LOW (ref >39)
LDL Chol Calc (NIH): 67 mg/dL (ref 0–99)
Triglycerides: 107 mg/dL (ref 0–149)
VLDL Cholesterol Cal: 20 mg/dL (ref 5–40)

## 2019-09-18 LAB — CMP14+EGFR
ALT: 31 IU/L (ref 0–44)
AST: 24 IU/L (ref 0–40)
Albumin/Globulin Ratio: 1.8 (ref 1.2–2.2)
Albumin: 4.5 g/dL (ref 3.8–4.9)
Alkaline Phosphatase: 77 IU/L (ref 48–121)
BUN/Creatinine Ratio: 16 (ref 9–20)
BUN: 11 mg/dL (ref 6–24)
Bilirubin Total: 0.7 mg/dL (ref 0.0–1.2)
CO2: 20 mmol/L (ref 20–29)
Calcium: 9.2 mg/dL (ref 8.7–10.2)
Chloride: 104 mmol/L (ref 96–106)
Creatinine, Ser: 0.69 mg/dL — ABNORMAL LOW (ref 0.76–1.27)
GFR calc Af Amer: 127 mL/min/{1.73_m2} (ref >59)
GFR calc non Af Amer: 110 mL/min/{1.73_m2} (ref >59)
Globulin, Total: 2.5 g/dL (ref 1.5–4.5)
Glucose: 90 mg/dL (ref 65–99)
Potassium: 4.4 mmol/L (ref 3.5–5.2)
Sodium: 142 mmol/L (ref 134–144)
Total Protein: 7 g/dL (ref 6.0–8.5)

## 2019-12-15 HISTORY — PX: OTHER SURGICAL HISTORY: SHX169

## 2020-01-24 NOTE — Telephone Encounter (Signed)
Closing chart, no action required 

## 2020-03-21 ENCOUNTER — Encounter: Payer: BC Managed Care – PPO | Admitting: Family Medicine

## 2020-05-01 ENCOUNTER — Encounter: Payer: BC Managed Care – PPO | Admitting: Emergency Medicine

## 2020-11-07 ENCOUNTER — Other Ambulatory Visit: Payer: Self-pay

## 2020-11-08 ENCOUNTER — Encounter: Payer: Self-pay | Admitting: Family Medicine

## 2020-11-08 ENCOUNTER — Other Ambulatory Visit: Payer: Self-pay

## 2020-11-08 ENCOUNTER — Ambulatory Visit: Payer: Self-pay | Admitting: Family Medicine

## 2020-11-08 VITALS — BP 134/78 | HR 66 | Temp 97.6°F | Ht 63.25 in | Wt 235.4 lb

## 2020-11-08 DIAGNOSIS — I10 Essential (primary) hypertension: Secondary | ICD-10-CM

## 2020-11-08 DIAGNOSIS — R7302 Impaired glucose tolerance (oral): Secondary | ICD-10-CM

## 2020-11-08 DIAGNOSIS — E7800 Pure hypercholesterolemia, unspecified: Secondary | ICD-10-CM

## 2020-11-08 DIAGNOSIS — Z8719 Personal history of other diseases of the digestive system: Secondary | ICD-10-CM | POA: Insufficient documentation

## 2020-11-08 LAB — LIPID PANEL
Cholesterol: 162 mg/dL (ref 0–200)
HDL: 71.5 mg/dL (ref >39.00)
LDL Cholesterol: 69 mg/dL (ref 0–99)
NonHDL: 90.8
Total CHOL/HDL Ratio: 2
Triglycerides: 109 mg/dL (ref 0.0–149.0)
VLDL: 21.8 mg/dL (ref 0.0–40.0)

## 2020-11-08 LAB — BASIC METABOLIC PANEL
BUN: 24 mg/dL — ABNORMAL HIGH (ref 6–23)
CO2: 25 mEq/L (ref 19–32)
Calcium: 9.5 mg/dL (ref 8.4–10.5)
Chloride: 97 mEq/L (ref 96–112)
Creatinine, Ser: 0.97 mg/dL (ref 0.40–1.50)
GFR: 89.64 mL/min (ref >60.00)
Glucose, Bld: 86 mg/dL (ref 70–99)
Potassium: 4.1 mEq/L (ref 3.5–5.1)
Sodium: 132 mEq/L — ABNORMAL LOW (ref 135–145)

## 2020-11-08 LAB — HEMOGLOBIN A1C: Hgb A1c MFr Bld: 6.6 % — ABNORMAL HIGH (ref 4.6–6.5)

## 2020-11-08 MED ORDER — LISINOPRIL 5 MG PO TABS: 5.0000 mg | ORAL_TABLET | Freq: Every day | ORAL | 1 refills | Status: DC

## 2020-11-08 MED ORDER — ATORVASTATIN CALCIUM 20 MG PO TABS: 20.0000 mg | ORAL_TABLET | Freq: Every day | ORAL | 3 refills | Status: DC

## 2020-11-08 MED ORDER — AMLODIPINE BESYLATE 10 MG PO TABS: 10.0000 mg | ORAL_TABLET | Freq: Every day | ORAL | 1 refills | Status: DC

## 2020-11-08 NOTE — Progress Notes (Signed)
[facility] [address] [place] [address] Dept: [phone] Dept Fax: [phone]  Transfer of Care Office Visit  Subjective:    Patient ID: Edward Vasquez, male    DOB: 08-29-1965, 55 y.o..   MRN: [id number]  Chief Complaint  Patient presents with   Establish Care    NP- establish care.  C/o having lower back pain x 2 months.  Has taken Tylenol & Ibuprofen.  Cramps in arms while working in the heat and feels dizzy when he lays down or gets up too fast.      History of Present Illness:  Patient is in today to establish care. Edward Vasquez is originally from [place]. He moved to the [place] when he was 55 years old, looking for work and a better life. He has been married about 25 years. He has two children (25, 22). 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 He denies tobacco, alcohol, or drug use.  Edward Vasquez has a history of hypertension, currently managed with amlodipine and lisinopril. He has a history of hyperlipidemia, managed with atorvastatin. He also has had prediabetes in the past.  Edward Vasquez notes he occasionally has issues with low back pain. He manages this with occasional Flexeril.  Edward Vasquez has a history of external hemorrhoids. He was having an issue with an anal fissure, but underwent surgery for this last year. He had been using Proctofoam, but no longer needs this.  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/08/2020   Glucose intolerance (impaired glucose tolerance) 08/01/2015   Pure hypercholesterolemia 08/01/2015   Obesity 01/16/2015   Essential hypertension 06/28/2011   External hemorrhoids 05/29/2010   Past Surgical History:  Procedure Laterality Date   Fissurerectomy  12/2019   Family History  Problem Relation Age of Onset   Diabetes Mother     Hypertension Mother    Hypertension Father    Mental illness Sister    Hypertension Sister    Stroke Brother    Hypertension Brother    Heart disease Maternal Grandmother    Alcohol abuse Maternal Grandfather    Outpatient Medications Prior to Visit  Medication Sig Dispense Refill   cyclobenzaprine (FLEXERIL) 10 MG tablet Take 1 tablet (10 mg total) by mouth 3 (three) times daily as needed for muscle spasms. 30 tablet 0   amLODipine (NORVASC) 10 MG tablet Take 1 tablet (10 mg total) by mouth daily. 90 tablet 1   atorvastatin (LIPITOR) 20 MG tablet Take 1 tablet (20 mg total) by mouth daily. 90 tablet 3   lisinopril (ZESTRIL) 5 MG tablet Take 1 tablet (5 mg total) by mouth daily. 90 tablet 1   meloxicam (MOBIC) 15 MG tablet Take 1 tablet (15 mg total) by mouth daily. (Patient not taking: Reported on 11/08/2020) 30 tablet 1   pramoxine-hydrocortisone (PROCTOCREAM-HC) 1-1 % rectal cream Place 1 application rectally 2 (two) times daily. (Patient not taking: Reported on 11/08/2020) 30 g 0   No facility-administered medications prior to visit.   No Known Allergies    Objective:   Today's Vitals   11/08/20 0826  BP: 134/78  Pulse: 66  Temp: 97.6 F (36.4 C)  TempSrc: Temporal  SpO2: 97%  Weight: 235 lb 6.4 oz (106.8 kg)  Height: 5' 3.25" (1.607 m)   Body mass index is 41.37 kg/m.   General: Well developed, well nourished. No acute distress. Psych:  Alert and oriented. Normal mood and affect.  Health Maintenance Due  Topic Date Due   Zoster Vaccines- Shingrix (1 of 2) Never done   COVID-19 Vaccine (3 - Booster) 01/25/2020     Assessment & Plan:   1. Essential hypertension Blood pressure is adequately treated at this point. I will renew his amlodipine and lisinopril. I will check a BMP to assess electrolytes and renal function.  - amLODipine (NORVASC) 10 MG tablet; Take 1 tablet (10 mg total) by mouth daily.  Dispense: 90 tablet; Refill: 1 - lisinopril (ZESTRIL) 5 MG tablet;  Take 1 tablet (5 mg total) by mouth daily.  Dispense: 90 tablet; Refill: 1 - Basic metabolic panel  2. Pure hypercholesterolemia I will continue atorvastatin. I will check lipids to assess for adequacy of treatment.  - atorvastatin (LIPITOR) 20 MG tablet; Take 1 tablet (20 mg total) by mouth daily.  Dispense: 90 tablet; Refill: 3 - Lipid panel  3. Glucose intolerance (impaired glucose tolerance) I will check an HbA1c to monitor for any progression of his prediabetes.  - Hemoglobin A1c  Loyola Mast, MD

## 2020-12-26 IMAGING — US BILATERAL CAROTID DUPLEX ULTRASOUND
1 series · 13 of 24 positions shown · non-contrast
Comparison: None.

CLINICAL DATA: Episode of expressive aphasia.

EXAM:
BILATERAL CAROTID DUPLEX ULTRASOUND
TECHNIQUE: Gray scale imaging, color Doppler and duplex ultrasound were
performed of bilateral carotid and vertebral arteries in the neck.

[Series 1: bilateral carotid duplex ultrasound · 0.06mm/px · 13 of 45 slices shown]
[im 1/45]
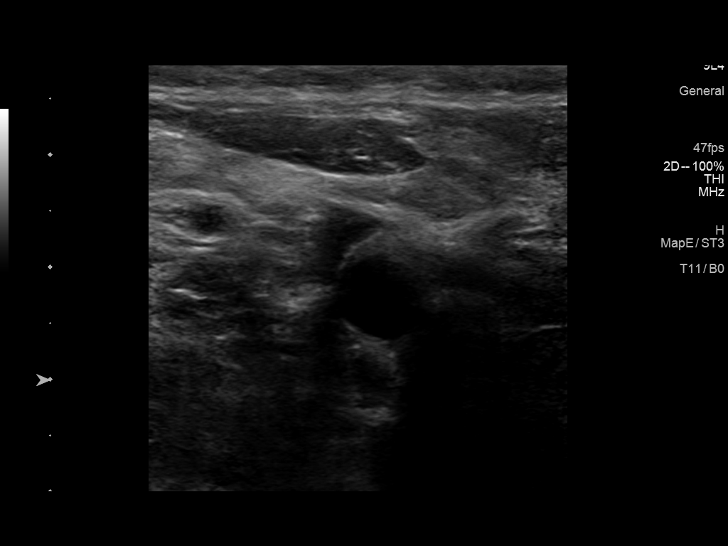
[im 4/45]
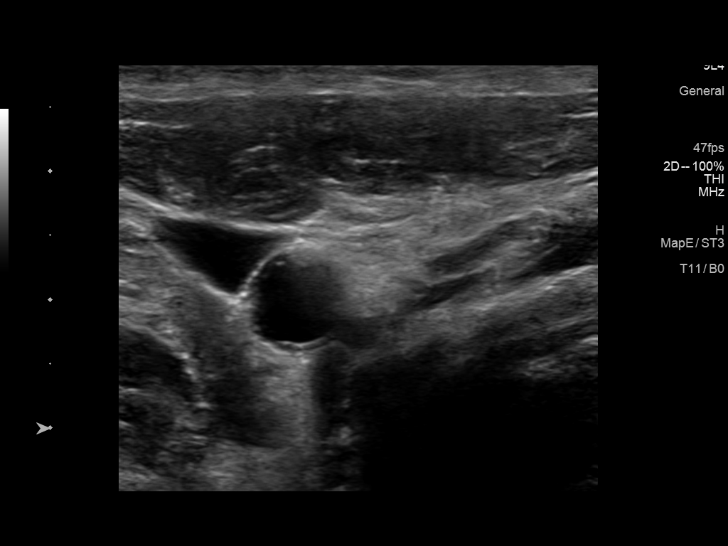
[im 8/45]
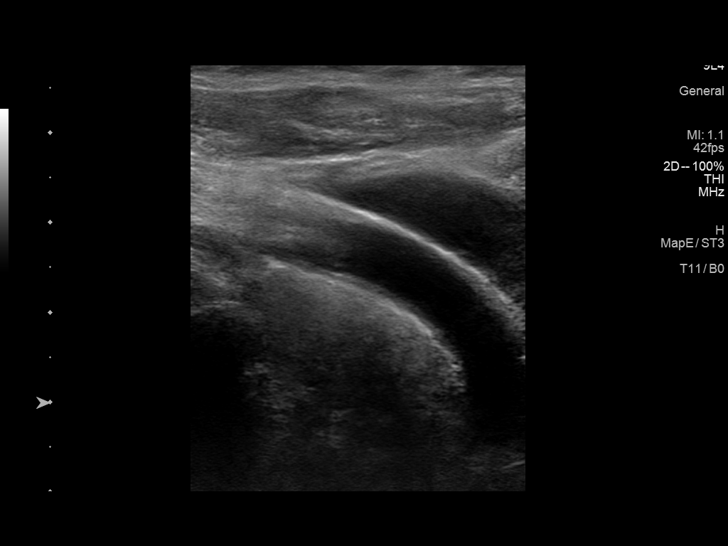
[im 12/45]
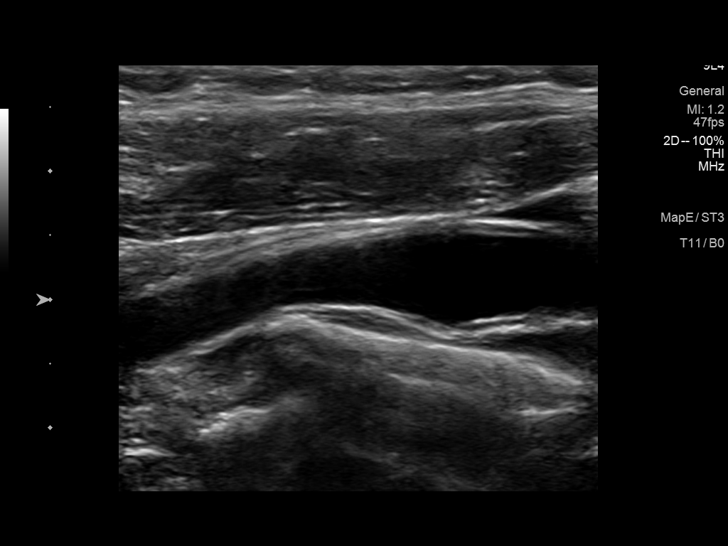
[im 16/45]
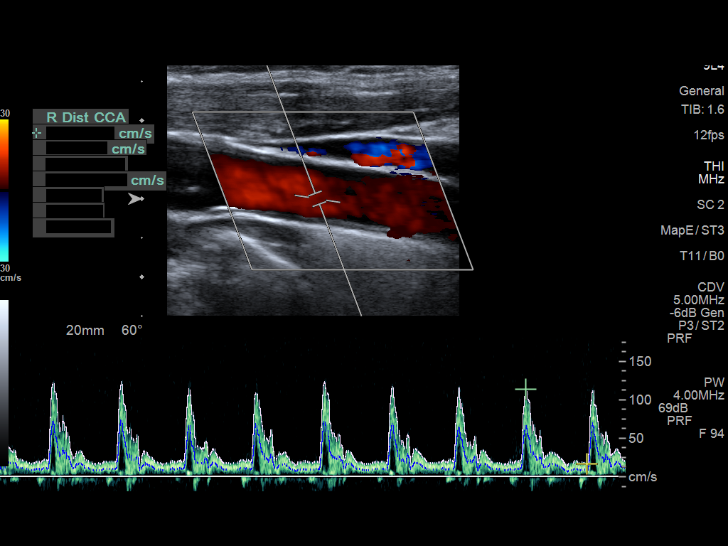
[im 20/45]
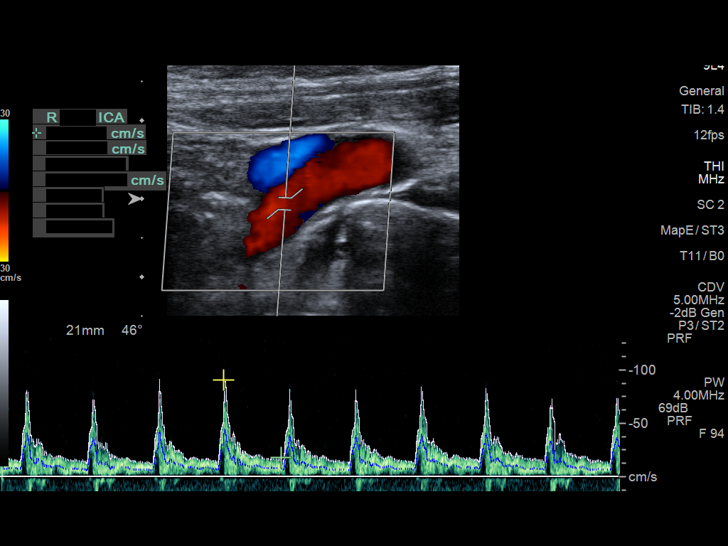
[im 23/45]
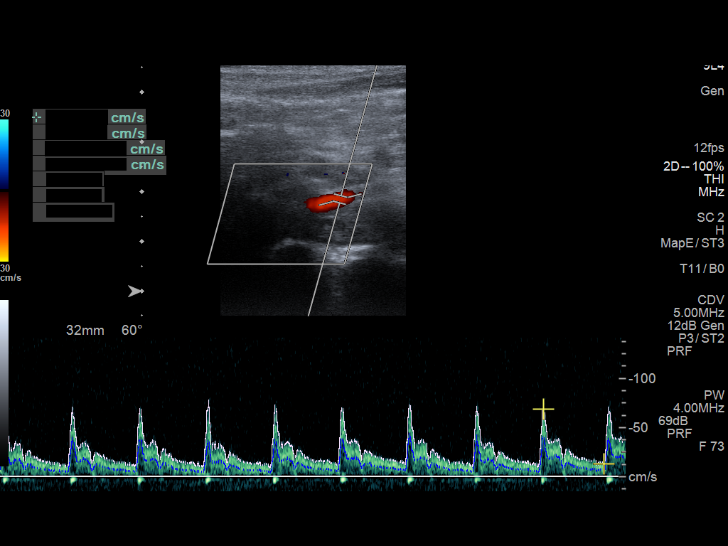
[im 25/45]
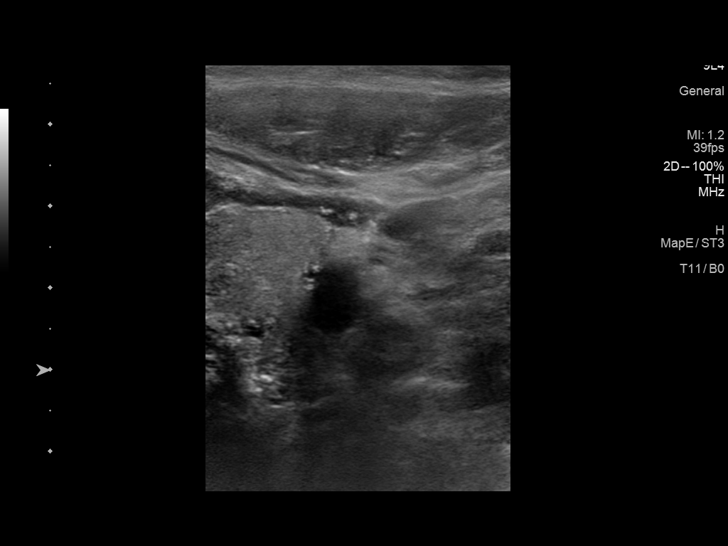
[im 29/45]
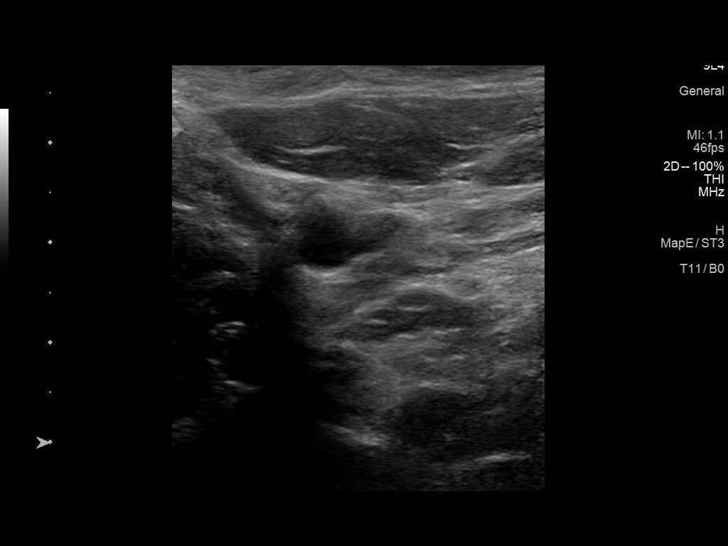
[im 33/45]
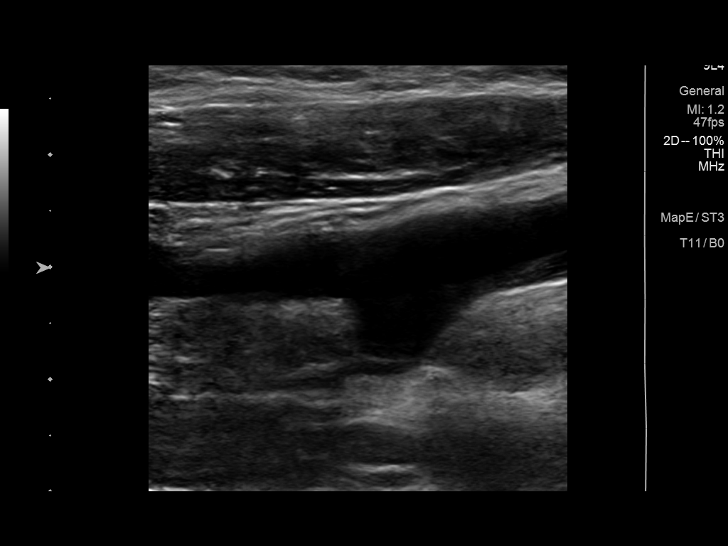
[im 37/45]
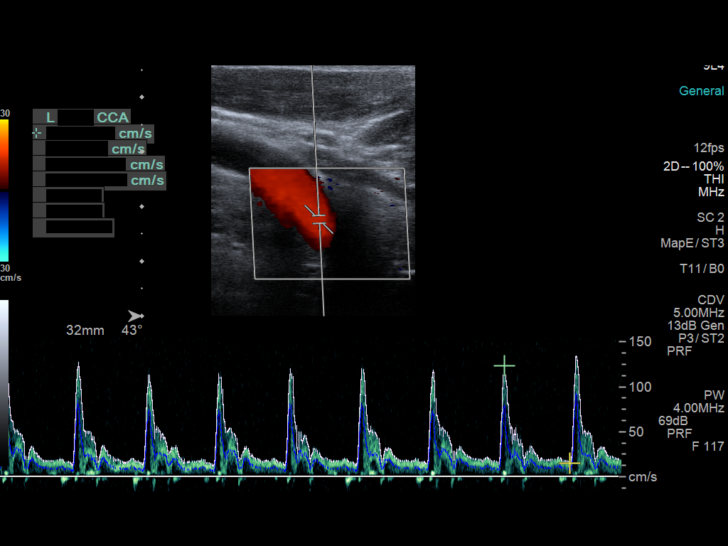
[im 41/45]
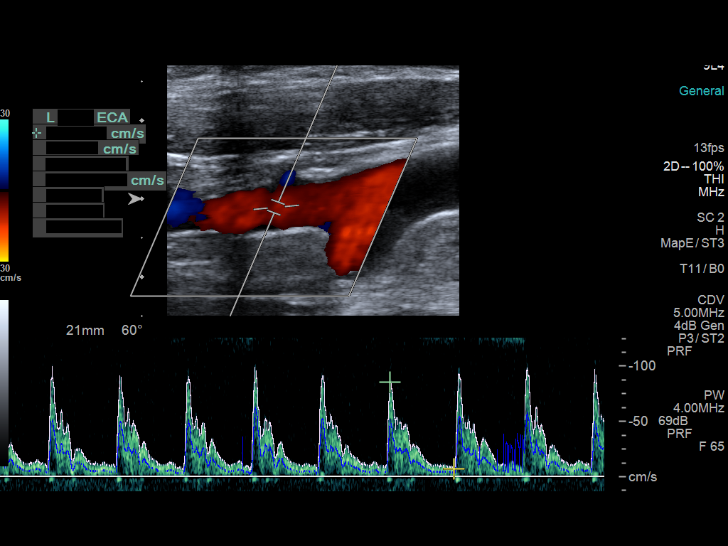
[im 45/45]
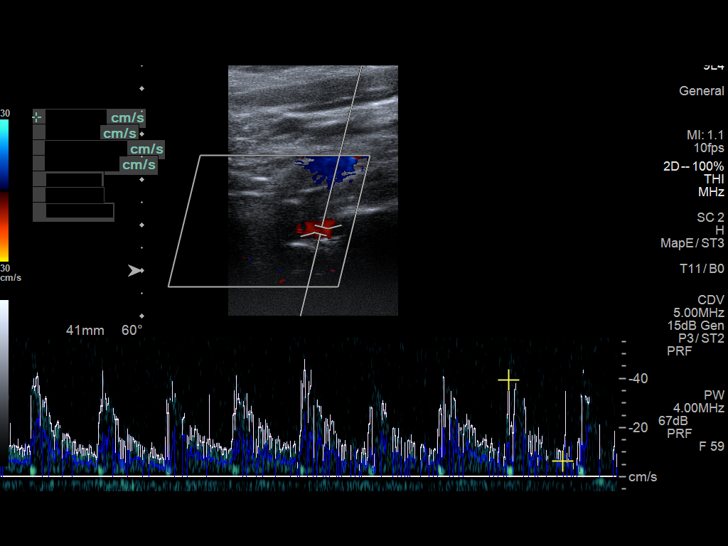

[13 of 24 positions shown; findings below may reference images not displayed]

FINDINGS: Criteria: Quantification of carotid stenosis is based on velocity
parameters that correlate the residual internal carotid diameter
with NASCET-based stenosis levels, using the diameter of the distal
internal carotid lumen as the denominator for stenosis measurement.

The following velocity measurements were obtained:

RIGHT

ICA: 91/18 cm/sec

CCA: 170/27 cm/sec

SYSTOLIC ICA/CCA RATIO:

ECA: 140 cm/sec

LEFT

ICA: 92/32 cm/sec

CCA: 172/22 cm/sec

SYSTOLIC ICA/CCA RATIO:

ECA: 86 cm/sec

RIGHT CAROTID ARTERY: Right carotid arteries are patent without
significant plaque or stenosis. Normal waveforms and velocities in
the internal carotid artery. External carotid artery is patent with
normal waveform.

RIGHT VERTEBRAL ARTERY: Antegrade flow and normal waveform in the
right vertebral artery.

LEFT CAROTID ARTERY: Left carotid arteries are patent without
significant plaque or stenosis. External carotid artery is patent
with normal waveform. Normal waveforms and velocities in the
internal carotid artery.

LEFT VERTEBRAL ARTERY: Antegrade flow and normal waveform in the
left vertebral artery.

Upper extremity blood pressures: RIGHT: 145/88 LEFT: 143/71
IMPRESSION: Normal carotid artery duplex examination. No evidence for carotid
artery plaque or stenosis.

## 2021-02-08 ENCOUNTER — Other Ambulatory Visit: Payer: Self-pay

## 2021-02-08 ENCOUNTER — Ambulatory Visit (INDEPENDENT_AMBULATORY_CARE_PROVIDER_SITE_OTHER): Payer: Self-pay | Admitting: Family Medicine

## 2021-02-08 ENCOUNTER — Encounter: Payer: Self-pay | Admitting: Family Medicine

## 2021-02-08 VITALS — BP 124/76 | HR 68 | Temp 97.5°F | Ht 63.5 in | Wt 243.2 lb

## 2021-02-08 DIAGNOSIS — I10 Essential (primary) hypertension: Secondary | ICD-10-CM

## 2021-02-08 DIAGNOSIS — E7800 Pure hypercholesterolemia, unspecified: Secondary | ICD-10-CM

## 2021-02-08 DIAGNOSIS — R252 Cramp and spasm: Secondary | ICD-10-CM

## 2021-02-08 DIAGNOSIS — R42 Dizziness and giddiness: Secondary | ICD-10-CM

## 2021-02-08 DIAGNOSIS — R7303 Prediabetes: Secondary | ICD-10-CM

## 2021-02-08 LAB — BASIC METABOLIC PANEL
BUN: 13 mg/dL (ref 6–23)
CO2: 24 mEq/L (ref 19–32)
Calcium: 9 mg/dL (ref 8.4–10.5)
Chloride: 109 mEq/L (ref 96–112)
Creatinine, Ser: 0.6 mg/dL (ref 0.40–1.50)
GFR: 110.64 mL/min (ref >60.00)
Glucose, Bld: 91 mg/dL (ref 70–99)
Potassium: 3.8 mEq/L (ref 3.5–5.1)
Sodium: 139 mEq/L (ref 135–145)

## 2021-02-08 LAB — HEMOGLOBIN A1C: Hgb A1c MFr Bld: 6 % (ref 4.6–6.5)

## 2021-02-08 MED ORDER — MECLIZINE HCL 25 MG PO TABS: 25.0000 mg | ORAL_TABLET | Freq: Three times a day (TID) | ORAL | 0 refills | Status: DC | PRN

## 2021-02-08 NOTE — Progress Notes (Signed)
[facility] Trecia Rogers [facility] [address] [place] [address] Dept: [phone] Dept Fax: [phone]  Chronic Care Office Visit  Subjective:    Patient ID: Edward Vasquez, male    DOB: 09-19-1965, 55 y.o..   MRN: [id number]  Chief Complaint  Patient presents with   Follow-up    3 month f/u HTN.  C/o having some dizziness/light headedness with movement in last 2 days.  Declines flu shot.      History of Present Illness:  Patient is in today for reassessment of chronic medical issues.  Edward Vasquez has a history of hypertension, currently managed with amlodipine and lisinopril. He has a history of hyperlipidemia, managed with atorvastatin. He also has had prediabetes in the past. At his last visit, his A1c was 6.6, but his blood sugar was normal (86).  Edward Vasquez notes he has a past history of vertigo. He has had a few episodes of this over the past 3 years. In the past 2 days, he describes a sense of dysequilibrium at work, esp. if he turns fast. He has not noted the room spinning sensation at night that he had before. He also continues to complain of episodes of muscle cramps (charley horses) in his leg muscles, esp. at night. These cause him to have to get up and stretch out the muscle.  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/08/2020   Prediabetes 08/01/2015   Pure hypercholesterolemia 08/01/2015   Obesity 01/16/2015   Essential hypertension 06/28/2011   External hemorrhoids 05/29/2010   Past Surgical History:  Procedure Laterality Date   Fissurerectomy  12/2019   Family History  Problem Relation Age of Onset   Diabetes Mother    Hypertension Mother    Hypertension Father    Mental illness Sister    Hypertension Sister    Stroke Brother    Hypertension Brother    Heart disease Maternal Grandmother    Alcohol abuse Maternal Grandfather     Outpatient Medications Prior to Visit  Medication Sig Dispense Refill   amLODipine (NORVASC) 10 MG tablet Take 1 tablet (10 mg total) by mouth daily. 90 tablet 1   atorvastatin (LIPITOR) 20 MG tablet Take 1 tablet (20 mg total) by mouth daily. 90 tablet 3   cyclobenzaprine (FLEXERIL) 10 MG tablet Take 1 tablet (10 mg total) by mouth 3 (three) times daily as needed for muscle spasms. 30 tablet 0   lisinopril (ZESTRIL) 5 MG tablet Take 1 tablet (5 mg total) by mouth daily. 90 tablet 1   naproxen sodium (ALEVE) 220 MG tablet Take 220 mg by mouth daily as needed.     No facility-administered medications prior to visit.   No Known Allergies    Objective:   Today's Vitals   02/08/21 0858  BP: 124/76  Pulse: 68  Temp: (!) 97.5 F (36.4 C)  TempSrc: Temporal  SpO2: 96%  Weight: 243 lb 3.2 oz (110.3 kg)  Height: 5' 3.5" (1.613 m)   Body mass index is 42.41 kg/m.   General: Well developed, well nourished. No acute distress. HEENT: Normocephalic, non-traumatic. External ears normal. EAC and TMs normal bilaterally.   PERRL, EOMI. Conjunctiva clear. Nose  moderately congested. Mucous membranes moist.   Oropharynx clear. Good dentition. Neck: Supple. No lymphadenopathy. No thyromegaly. Lungs: Clear to auscultation bilaterally. No wheezing, rales or rhonchi. CV: RRR without murmurs or rubs. Pulses 2+ bilaterally. Psych: Alert and oriented.  Normal mood and affect.  Health Maintenance Due  Topic Date Due   Zoster Vaccines- Shingrix (1 of 2) Never done   COVID-19 Vaccine (3 - Booster) 10/20/2019   INFLUENZA VACCINE  11/13/2020   Lab Results Lab Results  Component Value Date   CHOL 162 11/08/2020   HDL 71.50 11/08/2020   LDLCALC 69 11/08/2020   TRIG 109.0 11/08/2020   CHOLHDL 2 11/08/2020   BMP Latest Ref Rng & Units 11/08/2020 09/17/2019 03/18/2019  Glucose 70 - 99 mg/dL 86 90 91  BUN 6 - 23 mg/dL 63(J) 11 13  Creatinine 0.40 - 1.50 mg/dL 4.97 0.26(V) 7.85(Y)  BUN/Creat Ratio 9  - 20 - 16 18  Sodium 135 - 145 mEq/L 132(L) 142 142  Potassium 3.5 - 5.1 mEq/L 4.1 4.4 4.3  Chloride 96 - 112 mEq/L 97 104 106  CO2 19 - 32 mEq/L 25 20 21   Calcium 8.4 - 10.5 mg/dL 9.5 9.2 9.5   Lab Results  Component Value Date   HGBA1C 6.6 (H) 11/08/2020     Assessment & Plan:   1. Essential hypertension Blood pressure is at goal. Continue amlodipine and lisinopril.  2. Prediabetes We will repeat his A1c and fasting glucose today to follow-up on his prior elevated A1c. If this confirms diabetes, I will have him back soon to discuss possible treatments.  - Hemoglobin A1c - Basic metabolic panel  3. Pure hypercholesterolemia Lipids at goal. I explained that his cramps are not likely related to his statin.  4. Vertigo Edward Vasquez has a past history of vertigo. his current symptoms possibly relate, but could also be dehydration in light of his muscle cramps. I will provide some meclizine, but also recommend he be hydrating appropriately at work.  - meclizine (ANTIVERT) 25 MG tablet; Take 1 tablet (25 mg total) by mouth 3 (three) times daily as needed for dizziness.  Dispense: 30 tablet; Refill: 0  5. Leg cramp These sound like typical charlie horses. We discussed making sure he takes in at least one sports drink during the day (preferably sugar free) to replace salts he may be losing while sweating at work. I will check electrolytes ot make sure these are not deficient.  Loma Sousa, MD

## 2021-02-26 ENCOUNTER — Other Ambulatory Visit: Payer: Self-pay | Admitting: Family Medicine

## 2021-02-26 DIAGNOSIS — I10 Essential (primary) hypertension: Secondary | ICD-10-CM

## 2021-03-12 ENCOUNTER — Other Ambulatory Visit: Payer: Self-pay

## 2021-03-12 DIAGNOSIS — I10 Essential (primary) hypertension: Secondary | ICD-10-CM

## 2021-03-12 MED ORDER — LISINOPRIL 5 MG PO TABS: 5.0000 mg | ORAL_TABLET | Freq: Every day | ORAL | 1 refills | Status: DC

## 2021-05-10 ENCOUNTER — Other Ambulatory Visit: Payer: Self-pay

## 2021-05-11 ENCOUNTER — Ambulatory Visit (INDEPENDENT_AMBULATORY_CARE_PROVIDER_SITE_OTHER): Payer: Self-pay | Admitting: Family Medicine

## 2021-05-11 ENCOUNTER — Encounter: Payer: Self-pay | Admitting: Family Medicine

## 2021-05-11 VITALS — BP 118/74 | HR 59 | Temp 98.0°F | Ht 63.5 in | Wt 243.2 lb

## 2021-05-11 DIAGNOSIS — I10 Essential (primary) hypertension: Secondary | ICD-10-CM

## 2021-05-11 DIAGNOSIS — Z6841 Body Mass Index (BMI) 40.0 and over, adult: Secondary | ICD-10-CM

## 2021-05-11 DIAGNOSIS — E782 Mixed hyperlipidemia: Secondary | ICD-10-CM

## 2021-05-11 LAB — LIPID PANEL
Cholesterol: 131 mg/dL (ref 0–200)
HDL: 37.9 mg/dL — ABNORMAL LOW (ref >39.00)
LDL Cholesterol: 70 mg/dL (ref 0–99)
NonHDL: 93.39
Total CHOL/HDL Ratio: 3
Triglycerides: 117 mg/dL (ref 0.0–149.0)
VLDL: 23.4 mg/dL (ref 0.0–40.0)

## 2021-05-11 MED ORDER — AMLODIPINE BESYLATE 10 MG PO TABS: 10.0000 mg | ORAL_TABLET | Freq: Every day | ORAL | 3 refills | Status: DC

## 2021-05-11 MED ORDER — LISINOPRIL 5 MG PO TABS: 5.0000 mg | ORAL_TABLET | Freq: Every day | ORAL | 3 refills | Status: DC

## 2021-05-11 MED ORDER — ATORVASTATIN CALCIUM 20 MG PO TABS: 20.0000 mg | ORAL_TABLET | Freq: Every day | ORAL | 3 refills | Status: DC

## 2021-05-11 NOTE — Progress Notes (Signed)
[facility] [place] [address] Dept: [phone] Dept Fax: [phone]  Chronic Care Office Visit  Subjective:    Patient ID: Edward Vasquez, male    DOB: April 26, 1965, 56 y.o..   MRN: [id number]  Chief Complaint  Patient presents with   Follow-up    3 month f/u. , no concerns.       History of Present Illness:  Patient is in today for reassessment of chronic medical issues.  Mr. [name] has a history of hypertension, currently managed with amlodipine and lisinopril.   He has a history of hyperlipidemia, managed with atorvastatin.  Remains active with work. Denies any new issues.  Past Medical History: Patient Active Problem List   Diagnosis Date Noted   Morbid obesity with BMI of 40.0-44.9, adult (HCC) 02/08/2021   Vertigo 02/08/2021   History of anal fissures 11/08/2020   Prediabetes 08/01/2015   Hyperlipidemia 08/01/2015   Obesity 01/16/2015   Essential hypertension 06/28/2011   External hemorrhoids 05/29/2010   Past Surgical History:  Procedure Laterality Date   Fissurerectomy  12/2019   Family History  Problem Relation Age of Onset   Diabetes Mother    Hypertension Mother    Hypertension Father    Mental illness Sister    Hypertension Sister    Stroke Brother    Hypertension Brother    Heart disease Maternal Grandmother    Alcohol abuse Maternal Grandfather    Outpatient Medications Prior to Visit  Medication Sig Dispense Refill   cyclobenzaprine (FLEXERIL) 10 MG tablet Take 1 tablet (10 mg total) by mouth 3 (three) times daily as needed for muscle spasms. 30 tablet 0   meclizine (ANTIVERT) 25 MG tablet Take 1 tablet (25 mg total) by mouth 3 (three) times daily as needed for dizziness. 30 tablet 0   naproxen sodium (ALEVE) 220 MG tablet Take 220 mg by mouth daily as needed.     amLODipine (NORVASC) 10 MG tablet Take 1 tablet by mouth once daily 90 tablet 0    atorvastatin (LIPITOR) 20 MG tablet Take 1 tablet (20 mg total) by mouth daily. 90 tablet 3   lisinopril (ZESTRIL) 5 MG tablet Take 1 tablet (5 mg total) by mouth daily. 90 tablet 1   No facility-administered medications prior to visit.   No Known Allergies    Objective:   Today's Vitals   05/11/21 1007  BP: 118/74  Pulse: (!) 59  Temp: 98 F (36.7 C)  TempSrc: Temporal  SpO2: 97%  Weight: 243 lb 3.2 oz (110.3 kg)  Height: 5' 3.5" (1.613 m)   Body mass index is 42.41 kg/m.   General: Well developed, well nourished. No acute distress. Psych: Alert and oriented. Normal mood and affect.  Health Maintenance Due  Topic Date Due   Zoster Vaccines- Shingrix (1 of 2) Never done   Fecal DNA (Cologuard)  03/28/2021   Lab Results Last lipids Lab Results  Component Value Date   CHOL 162 11/08/2020   HDL 71.50 11/08/2020   LDLCALC 69 11/08/2020   TRIG 109.0 11/08/2020   CHOLHDL 2 11/08/2020   Last hemoglobin A1c Lab Results  Component Value Date   HGBA1C 6.0 02/08/2021   BMP Latest Ref Rng & Units 02/08/2021 11/08/2020 09/17/2019  Glucose 70 - 99 mg/dL 91 86 90  BUN 6 - 23 mg/dL 13 71(G) 11  Creatinine 0.40 - 1.50 mg/dL 6.26 9.48 5.46(E)  BUN/Creat Ratio 9 - 20 - - 16  Sodium 135 - 145 mEq/L 139 132(L) 142  Potassium 3.5 - 5.1 mEq/L 3.8 4.1 4.4  Chloride 96 - 112 mEq/L 109 97 104  CO2 19 - 32 mEq/L 24 25 20   Calcium 8.4 - 10.5 mg/dL 9.0 9.5 9.2   Assessment & Plan:   1. Essential hypertension Blood pressure is at goal today. We will continue amlodipine and lisinopril.  - amLODipine (NORVASC) 10 MG tablet; Take 1 tablet (10 mg total) by mouth daily.  Dispense: 90 tablet; Refill: 3 - lisinopril (ZESTRIL) 5 MG tablet; Take 1 tablet (5 mg total) by mouth daily.  Dispense: 90 tablet; Refill: 3  2. Mixed hyperlipidemia Continue atorvastatin. Due for semi-annual lipid check.  - atorvastatin (LIPITOR) 20 MG tablet; Take 1 tablet (20 mg total) by mouth daily.  Dispense: 90  tablet; Refill: 3 - Lipid panel  3. Morbid obesity with BMI of 40.0-44.9, adult (HCC) Weight is stable. I encouraged him to remain physically active.  01-26-1982, MD

## 2021-08-10 ENCOUNTER — Ambulatory Visit: Payer: BC Managed Care – PPO | Admitting: Family Medicine

## 2021-08-10 ENCOUNTER — Encounter: Payer: Self-pay | Admitting: Family Medicine

## 2021-08-10 VITALS — BP 138/86 | HR 65 | Temp 98.0°F | Wt 250.2 lb

## 2021-08-10 DIAGNOSIS — I10 Essential (primary) hypertension: Secondary | ICD-10-CM

## 2021-08-10 DIAGNOSIS — Z6841 Body Mass Index (BMI) 40.0 and over, adult: Secondary | ICD-10-CM

## 2021-08-10 DIAGNOSIS — E782 Mixed hyperlipidemia: Secondary | ICD-10-CM

## 2021-08-10 DIAGNOSIS — S93492A Sprain of other ligament of left ankle, initial encounter: Secondary | ICD-10-CM

## 2021-08-10 DIAGNOSIS — S8011XA Contusion of right lower leg, initial encounter: Secondary | ICD-10-CM | POA: Diagnosis not present

## 2021-08-10 NOTE — Patient Instructions (Signed)
Esguince de tobillo Ankle Sprain  Un esguince de tobillo es una distensin o un desgarro en uno de los tejidos resistentes (ligamentos) que conectan los huesos del tobillo. Puede ocurrir un esguince de tobillo cuando el tobillo gira hacia afuera (esguince por inversin) o hacia adentro (esguince por eversin). Cules son las causas? Esta afeccin es consecuencia de girar o torcer el tobillo. Qu incrementa el riesgo? Es ms probable que tenga esta afeccin si practica deportes. Cules son los signos o sntomas? Los sntomas de esta afeccin incluyen: Dolor en el tobillo. Hinchazn. Moretones. Estos pueden aparecer inmediatamente despus del esguince de tobillo, o de 1 a 2 das despus. Dificultad para mantenerse de pie o caminar. Cmo se diagnostica? Esta afeccin se diagnostica mediante: Un examen fsico. Durante el examen, el mdico le presionar ciertas partes del pie y el tobillo e intentar moverlas de formas determinadas. Radiografas. Es posible que le tomen radiografas para determinar qu tan serio es el esguince y para ver si hay huesos fracturados. Cmo se trata? El tratamiento de esta afeccin puede incluir: Un dispositivo ortopdico o una frula. Se utiliza para evitar los movimientos del tobillo hasta que se cure. Una venda elstica. Se utiliza para sostener el tobillo. Muletas. Analgsicos. Ciruga. Esta puede ser necesaria si el esguince es muy serio. Fisioterapia. Esta puede ayudar a mejorar el movimiento del tobillo. Siga estas instrucciones en su casa: Si tiene un dispositivo ortopdico o una frula: Use el dispositivo ortopdico o la frula como se lo haya indicado el mdico. Quteselos solamente como se lo haya indicado el mdico. Afloje el dispositivo ortopdico o la frula si los dedos de los pies: Hormiguean. Pierden la sensibilidad (se adormecen). Se tornan fros y de color azul. Mantenga el dispositivo ortopdico y la frula limpios. Si el dispositivo  ortopdico o la frula no son impermeables: No deje que se mojen. Cbralos con un envoltorio hermtico cuando tome un bao de inmersin o una ducha. Si tiene una venda elstica (vendaje): Qutesela para ducharse o para baarse. Trate de no mover mucho el tobillo, pero mueva los dedos de vez en cuando. Esto ayuda a evitar la hinchazn. Acomode el vendaje si lo siente demasiado ajustado. Afloje el vendaje si el pie: Pierde sensibilidad. Siente hormigueos. Se torna fro y de color azul. Control del dolor, la rigidez y la hinchazn  Use los medicamentos de venta libre y los recetados solamente como se lo haya indicado el mdico. Durante 2 o 3 das, mantenga el tobillo en alto (elevado), por encima del nivel del corazn. Aplique hielo sobre la zona lesionada, si se lo indican: Si tiene un dispositivo ortopdico o una frula desmontable, quteselo como se lo haya indicado el mdico. Ponga el hielo en una bolsa plstica. Coloque una toalla entre la piel y la bolsa. Aplique el hielo durante 20 minutos, 2 a 3 veces por da. Indicaciones generales Mantenga el tobillo en reposo. No apoye el peso del cuerpo sobre la pierna lesionada hasta que el mdico lo autorice. Utilice las muletas como se lo haya indicado el mdico. No consuma ningn producto que contenga nicotina o tabaco, como cigarrillos, cigarrillos electrnicos y tabaco de mascar. Si necesita ayuda para dejar de consumir estos productos, consulte al mdico. Concurra a todas las visitas de seguimiento como se lo haya indicado el mdico. Comunquese con un mdico si: Los moretones o la hinchazn empeoran rpidamente. El dolor no mejora despus de tomar medicamentos. Solicite ayuda de inmediato si: No puede sentir el pie o los dedos del pie.   El pie o los dedos del pie estn de color azul. Siente un dolor muy intenso que empeora. Resumen Un esguince de tobillo es una distensin o un desgarro en uno de los tejidos resistentes (ligamentos)  que conectan los huesos del tobillo. Esta afeccin es consecuencia de girar o torcer el tobillo. Los sntomas incluyen dolor, hinchazn, moretones y problemas para caminar. Para ayudar con el dolor y la hinchazn, ponga hielo sobre el tobillo lesionado, levante el tobillo por encima del nivel del corazn y use una venda elstica. Adems, haga reposo como se lo haya indicado el mdico. Concurra a todas las visitas de seguimiento como se lo haya indicado el mdico. Esto es importante. Esta informacin no tiene como fin reemplazar el consejo del mdico. Asegrese de hacerle al mdico cualquier pregunta que tenga. Document Revised: 06/18/2020 Document Reviewed: 06/18/2020 Elsevier Patient Education  2023 [employer]vier Inc.  

## 2021-08-10 NOTE — Progress Notes (Signed)
?[facility] ?[facility] ?[address] ?[place] [address] ?Dept: [phone] ?Dept Fax: [phone] ? ?Chronic Care Office Visit ? ?Subjective:  ? ? Patient ID: Edward Vasquez, male    DOB: Dec 30, 1965, 56 y.o..   MRN: [id number] ? ?Chief Complaint  ?Patient presents with  ? Follow-up  ?  3 mo f/u HTN. Pt c/o swelling w/ tenderness and pain in rt knee and leg due to injury x1 wk.   ? ? ?History of Present Illness: ? ?Patient is in today for reassessment of chronic medical issues. ? ?[name]-Espinoza has a history of hypertension, currently managed with amlodipine and lisinopril.  ?  ?He has a history of hyperlipidemia, managed with atorvastatin. ? ?[name]-Espinoza notes that about 11 days ago, he fell at work. He was on an uneven surface. His left ankle everted and he fell forward, striking his right leg. He has an abrasion to his right knee. He has swelling of the right leg and notes bruising around both ankles. He has used some Aleve about once a day. He also is using an ankle support inside his work boot for the left ankle. ? ?Past Medical History: ?Patient Active Problem List  ? Diagnosis Date Noted  ? Morbid obesity with BMI of 40.0-44.9, adult (HCC) 02/08/2021  ? Vertigo 02/08/2021  ? History of anal fissures 11/08/2020  ? Prediabetes 08/01/2015  ? Hyperlipidemia 08/01/2015  ? Essential hypertension 06/28/2011  ? External hemorrhoids 05/29/2010  ? ?Past Surgical History:  ?Procedure Laterality Date  ? Fissurerectomy  12/2019  ? ?Family History  ?Problem Relation Age of Onset  ? Diabetes Mother   ? Hypertension Mother   ? Hypertension Father   ? Mental illness Sister   ? Hypertension Sister   ? Stroke Brother   ? Hypertension Brother   ? Heart disease Maternal Grandmother   ? Alcohol abuse Maternal Grandfather   ? ?Outpatient Medications Prior to Visit  ?Medication Sig Dispense Refill  ? amLODipine (NORVASC) 10 MG tablet Take 1 tablet (10 mg total)  by mouth daily. 90 tablet 3  ? atorvastatin (LIPITOR) 20 MG tablet Take 1 tablet (20 mg total) by mouth daily. 90 tablet 3  ? lisinopril (ZESTRIL) 5 MG tablet Take 1 tablet (5 mg total) by mouth daily. 90 tablet 3  ? cyclobenzaprine (FLEXERIL) 10 MG tablet Take 1 tablet (10 mg total) by mouth 3 (three) times daily as needed for muscle spasms. 30 tablet 0  ? meclizine (ANTIVERT) 25 MG tablet Take 1 tablet (25 mg total) by mouth 3 (three) times daily as needed for dizziness. 30 tablet 0  ? naproxen sodium (ALEVE) 220 MG tablet Take 220 mg by mouth daily as needed.    ? ?No facility-administered medications prior to visit.  ? ?No Known Allergies ?   ?Objective:  ? ?Today's Vitals  ? 08/10/21 0914  ?BP: 138/86  ?Pulse: 65  ?Temp: 98 ?F (36.7 ?C)  ?TempSrc: Temporal  ?SpO2: 96%  ?Weight: 250 lb 3.2 oz (113.5 kg)  ? ?Body mass index is 43.63 kg/m?.  ? ?General: Well developed, well nourished. No acute distress. ?CV: Regular rate and rhythm without murmurs or rubs. ?Extremities: There is a healing abrasion just below the right knee. There is some mild to moderate  ? swelling of the right lower leg.  Bruising is noted encircling the right lower leg above the ankle and ?  medially at the ankle. There is also bruising of the left ankle laterally.  Tenderness noted behind the  ? left lateral malleolus. Tilt and Drawer neg. Able to bear weight and ambulate normally.  ?Psych: Alert and oriented. Normal mood and affect. ? ?Health Maintenance Due  ?Topic Date Due  ? Zoster Vaccines- Shingrix (1 of 2) Never done  ? Fecal DNA (Cologuard)  03/28/2021  ? ?Lab Results ?Last lipids ?Lab Results  ?Component Value Date  ? CHOL 131 05/11/2021  ? HDL 37.90 (L) 05/11/2021  ? LDLCALC 70 05/11/2021  ? TRIG 117.0 05/11/2021  ? CHOLHDL 3 05/11/2021  ?   ?Assessment & Plan:  ? ?1. Essential hypertension ?Blood pressure is adequately controlled today. Continue amlodipine 10 mg daily and lisinopril 5 mg daily. ? ?2. Morbid obesity with BMI of  40.0-44.9, adult (HCC) ?Weight is up 15 lbs. since July. Mr. Loper is surprised by this and wonders if his medicine is causing this. I discussed with him that this is likely due to excess calories and not getting routine exercise outside of work. ? ?3. Mixed hyperlipidemia ?Lipids at goal on atorvastatin 20 mg daily. ? ?4. Contusion of right lower extremity, initial encounter ?5. Sprain of posterior talofibular ligament of left ankle, initial encounter ?[name]-Espinoza's injuries appear overall mild. He is improving. I recommend he elevate the right leg this weekend to reduce swelling.  He can continue to use an occasional Aleve, but would not recommend he take this long term. He can continue use of his ankle support. We discussed him doing weight toe rasies once the ankle improves to reduce the risk for reinjury. ? ?Return in about 3 months (around 11/09/2021) for Reassessment.  ? ?Loyola Mast, MD ?

## 2021-11-09 ENCOUNTER — Ambulatory Visit: Payer: BC Managed Care – PPO | Admitting: Family Medicine

## 2021-11-30 ENCOUNTER — Ambulatory Visit (INDEPENDENT_AMBULATORY_CARE_PROVIDER_SITE_OTHER): Payer: Self-pay | Admitting: Family Medicine

## 2021-11-30 VITALS — BP 130/74 | HR 65 | Temp 97.5°F | Ht 63.0 in | Wt 249.6 lb

## 2021-11-30 DIAGNOSIS — I10 Essential (primary) hypertension: Secondary | ICD-10-CM

## 2021-11-30 DIAGNOSIS — Z6841 Body Mass Index (BMI) 40.0 and over, adult: Secondary | ICD-10-CM

## 2021-11-30 DIAGNOSIS — E782 Mixed hyperlipidemia: Secondary | ICD-10-CM

## 2021-11-30 NOTE — Progress Notes (Signed)
[facility] PRIMARY CARE [facility] PRIMARY Trecia Rogers [facility] [address] [place] [address] Dept: [phone] Dept Fax: [phone]  Chronic Care Office Visit  Subjective:    Patient ID: Edward Vasquez, male    DOB: Nov 11, 1965, 56 y.o..   MRN: [id number]  Chief Complaint  Patient presents with   Follow-up    3 mth f/u for HTN no concerns    History of Present Illness:  Patient is in today for reassessment of chronic medical issues.  Edward Vasquez has a history of hypertension, currently managed with amlodipine 10 mg daily and lisinopril 5 mg daily. He notes that he is currently not working and has lost his insurance coverage. He is not exercising regularly. He notes he eats a typical [place] diet. He eats out only 1-2 times a week. He admits to eating tortillas regularly. These are locally made.   He has a history of hyperlipidemia, managed with atorvastatin 20 mg daily.  Past Medical History: Patient Active Problem List   Diagnosis Date Noted   Morbid obesity with BMI of 40.0-44.9, adult (HCC) 02/08/2021   Vertigo 02/08/2021   History of anal fissures 11/08/2020   Prediabetes 08/01/2015   Hyperlipidemia 08/01/2015   Essential hypertension 06/28/2011   External hemorrhoids 05/29/2010   Past Surgical History:  Procedure Laterality Date   Fissurerectomy  12/2019   Family History  Problem Relation Age of Onset   Diabetes Mother    Hypertension Mother    Hypertension Father    Mental illness Sister    Hypertension Sister    Stroke Brother    Hypertension Brother    Heart disease Maternal Grandmother    Alcohol abuse Maternal Grandfather    Outpatient Medications Prior to Visit  Medication Sig Dispense Refill   amLODipine (NORVASC) 10 MG tablet Take 1 tablet (10 mg total) by mouth daily. 90 tablet 3   atorvastatin (LIPITOR) 20 MG tablet Take 1 tablet (20 mg total) by mouth daily. 90 tablet 3   cyclobenzaprine (FLEXERIL) 10 MG tablet Take  1 tablet (10 mg total) by mouth 3 (three) times daily as needed for muscle spasms. 30 tablet 0   lisinopril (ZESTRIL) 5 MG tablet Take 1 tablet (5 mg total) by mouth daily. 90 tablet 3   meclizine (ANTIVERT) 25 MG tablet Take 1 tablet (25 mg total) by mouth 3 (three) times daily as needed for dizziness. 30 tablet 0   naproxen sodium (ALEVE) 220 MG tablet Take 220 mg by mouth daily as needed.     No facility-administered medications prior to visit.   No Known Allergies    Objective:   Today's Vitals   11/30/21 0854  BP: 130/74  Pulse: 65  Temp: (!) 97.5 F (36.4 C)  TempSrc: Temporal  SpO2: 95%  Weight: 249 lb 9.6 oz (113.2 kg)  Height: 5\' 3"  (1.6 m)   Body mass index is 44.21 kg/m.   General: Well developed, well nourished. No acute distress. Psych: Alert and oriented. Normal mood and affect.  Health Maintenance Due  Topic Date Due   Zoster Vaccines- Shingrix (1 of 2) Never done   INFLUENZA VACCINE  11/13/2021   Lab Results Last lipids Lab Results  Component Value Date   CHOL 131 05/11/2021   HDL 37.90 (L) 05/11/2021   LDLCALC 70 05/11/2021   TRIG 117.0 05/11/2021   CHOLHDL 3 05/11/2021      Latest Ref Rng & Units 02/08/2021    9:42 AM 11/08/2020    9:12 AM 09/17/2019  8:20 AM  BMP  Glucose 70 - 99 mg/dL 91  86  90   BUN 6 - 23 mg/dL 13  24  11    Creatinine 0.40 - 1.50 mg/dL  6.96  2.95   BUN/Creat Ratio 9 - 20   16   Sodium 135 - 145 mEq/L 139  132  142   Potassium 3.5 - 5.1 mEq/L 3.8  4.1  4.4   Chloride 96 - 112 mEq/L 109  97  104   CO2 19 - 32 mEq/L 24  25  20    Calcium 8.4 - 10.5 mg/dL 9.0  9.5  9.2      Assessment & Plan:   1. Essential hypertension Blood pressure is improved from the last visit, though still running higher than last Fall/Winter. I will continue him on amlodipine 10 mg and lisinopril 5 mg daily. I emphasized the role that weight loss could have in improving his blood pressure control.  2. Mixed hyperlipidemia Lipids have been  at goal on atorvastatin 20 mg daily.  3. Morbid obesity with BMI of 40.0-44.9, adult (HCC) Weight is stable since April. He was weighting about 8-10 lbs less last Fall/Winter, at which point his blood pressure was in better control. We discussed him trying to include at least some walking regularly outside of work. I also discussed reducing how often he eats tortillas as a way to reduce calories.  Return in about 3 months (around 03/02/2022) for Reassessment.   May, MD

## 2022-03-15 ENCOUNTER — Encounter: Payer: Self-pay | Admitting: Family Medicine

## 2022-03-15 ENCOUNTER — Ambulatory Visit (INDEPENDENT_AMBULATORY_CARE_PROVIDER_SITE_OTHER): Payer: Self-pay | Admitting: Family Medicine

## 2022-03-15 VITALS — BP 120/66 | HR 75 | Temp 97.9°F | Ht 63.0 in | Wt 238.8 lb

## 2022-03-15 DIAGNOSIS — Z6841 Body Mass Index (BMI) 40.0 and over, adult: Secondary | ICD-10-CM

## 2022-03-15 DIAGNOSIS — E782 Mixed hyperlipidemia: Secondary | ICD-10-CM

## 2022-03-15 DIAGNOSIS — I10 Essential (primary) hypertension: Secondary | ICD-10-CM

## 2022-03-15 MED ORDER — LISINOPRIL 5 MG PO TABS: 5.0000 mg | ORAL_TABLET | Freq: Every day | ORAL | 3 refills | Status: DC

## 2022-03-15 MED ORDER — ATORVASTATIN CALCIUM 20 MG PO TABS: 20.0000 mg | ORAL_TABLET | Freq: Every day | ORAL | 3 refills | Status: DC

## 2022-03-15 MED ORDER — AMLODIPINE BESYLATE 10 MG PO TABS: 10.0000 mg | ORAL_TABLET | Freq: Every day | ORAL | 3 refills | Status: DC

## 2022-03-15 NOTE — Progress Notes (Signed)
Edward Vasquez Dept: [phone] Dept Fax: [phone]  Chronic Care Office Visit  Subjective:    Patient ID: Edward Vasquez, male    DOB: July 23, 1965, 56 y.o..   MRN: [id number]  Chief Complaint  Patient presents with   Follow-up    3 month f/u.  No concerns. Fasting today. Refills     History of Present Illness:  Patient is in today for reassessment of chronic medical issues.  Edward Vasquez has a history of hypertension, currently managed with amlodipine 10 mg daily and lisinopril 5 mg daily. He continues to be out of work and without Copy. He is not exercising regularly. He has reduced how much food he is eating, and is pleased to see his weight is down some.   He has a history of hyperlipidemia, managed with atorvastatin 20 mg daily.  Past Medical History: Patient Active Problem List   Diagnosis Date Noted   Morbid obesity with BMI of 40.0-44.9, adult ([place]) 02/08/2021   Vertigo 02/08/2021   History of anal fissures 11/08/2020   Prediabetes 08/01/2015   Hyperlipidemia 08/01/2015   Essential hypertension 06/28/2011   External hemorrhoids 05/29/2010   Past Surgical History:  Procedure Laterality Date   Fissurerectomy  12/2019   Family History  Problem Relation Age of Onset   Diabetes Mother    Hypertension Mother    Hypertension Father    Mental illness Sister    Hypertension Sister    Stroke Brother    Hypertension Brother    Heart disease Maternal Grandmother    Alcohol abuse Maternal Grandfather    Outpatient Medications Prior to Visit  Medication Sig Dispense Refill   meclizine (ANTIVERT) 25 MG tablet Take 1 tablet (25 mg total) by mouth 3 (three) times daily as needed for dizziness. 30 tablet 0   naproxen sodium (ALEVE) 220 MG tablet Take 220 mg by mouth daily as needed.     amLODipine (NORVASC) 10 MG tablet Take 1 tablet (10 mg total) by  mouth daily. 90 tablet 3   atorvastatin (LIPITOR) 20 MG tablet Take 1 tablet (20 mg total) by mouth daily. 90 tablet 3   cyclobenzaprine (FLEXERIL) 10 MG tablet Take 1 tablet (10 mg total) by mouth 3 (three) times daily as needed for muscle spasms. 30 tablet 0   lisinopril (ZESTRIL) 5 MG tablet Take 1 tablet (5 mg total) by mouth daily. 90 tablet 3   No facility-administered medications prior to visit.   No Known Allergies    Objective:   Today's Vitals   03/15/22 0919  BP: 120/66  Pulse: 75  Temp: 97.9 F (36.6 C)  TempSrc: Temporal  SpO2: 96%  Weight: 238 lb 12.8 oz (108.3 kg)  Height: 5\' 3"  (1.6 m)   Body mass index is 42.3 kg/m.   General: Well developed, well nourished. No acute distress. Psych: Alert and oriented. Normal mood and affect.  Health Maintenance Due  Topic Date Due   Zoster Vaccines- Shingrix (1 of 2) Never done     Assessment & Plan:   1. Essential hypertension Blood pressure is at goal today. His recent weight loss has likely helped this.  - amLODipine (NORVASC) 10 MG tablet; Take 1 tablet (10 mg total) by mouth daily.  Dispense: 90 tablet; Refill: 3 - lisinopril (ZESTRIL) 5 MG tablet; Take 1 tablet (5 mg total) by mouth daily.  Dispense: 90 tablet; Refill: 3  2. Mixed hyperlipidemia Plan on  fasting lipids at next appointment.  - atorvastatin (LIPITOR) 20 MG tablet; Take 1 tablet (20 mg total) by mouth daily.  Dispense: 90 tablet; Refill: 3  3. Morbid obesity with BMI of 40.0-44.9, adult (HCC) Maximum weight: 250 lbs (08/10/2021) Current weight: 238 lbs Weight change since last visit: - 11 lbs Total weight loss: 12 lbs (4.8%)  I strongly encourage him to continue with his current efforts at eating. He would benefit from adding in some regular exercise, such as daily walking. I pointed out the improvement in his BP, indicating this is helping to improve his health overall.  Return in about 3 months (around 06/14/2022) for Reassessment.   Loyola Mast, MD

## 2022-06-14 ENCOUNTER — Encounter: Payer: Self-pay | Admitting: Family Medicine

## 2022-06-14 ENCOUNTER — Ambulatory Visit (INDEPENDENT_AMBULATORY_CARE_PROVIDER_SITE_OTHER): Payer: Self-pay | Admitting: Family Medicine

## 2022-06-14 VITALS — BP 124/76 | HR 55 | Temp 98.1°F | Ht 63.0 in | Wt 246.8 lb

## 2022-06-14 DIAGNOSIS — M542 Cervicalgia: Secondary | ICD-10-CM

## 2022-06-14 DIAGNOSIS — I10 Essential (primary) hypertension: Secondary | ICD-10-CM

## 2022-06-14 DIAGNOSIS — R7303 Prediabetes: Secondary | ICD-10-CM

## 2022-06-14 DIAGNOSIS — E782 Mixed hyperlipidemia: Secondary | ICD-10-CM

## 2022-06-14 LAB — LIPID PANEL
Cholesterol: 128 mg/dL (ref 0–200)
HDL: 37.4 mg/dL — ABNORMAL LOW (ref >39.00)
LDL Cholesterol: 75 mg/dL (ref 0–99)
NonHDL: 90.53
Total CHOL/HDL Ratio: 3
Triglycerides: 76 mg/dL (ref 0.0–149.0)
VLDL: 15.2 mg/dL (ref 0.0–40.0)

## 2022-06-14 LAB — BASIC METABOLIC PANEL
BUN: 12 mg/dL (ref 6–23)
CO2: 27 mEq/L (ref 19–32)
Calcium: 9.5 mg/dL (ref 8.4–10.5)
Chloride: 105 mEq/L (ref 96–112)
Creatinine, Ser: 0.69 mg/dL (ref 0.40–1.50)
GFR: 105.07 mL/min (ref >60.00)
Glucose, Bld: 84 mg/dL (ref 70–99)
Potassium: 4.1 mEq/L (ref 3.5–5.1)
Sodium: 140 mEq/L (ref 135–145)

## 2022-06-14 LAB — HEMOGLOBIN A1C: Hgb A1c MFr Bld: 6.1 % (ref 4.6–6.5)

## 2022-06-14 NOTE — Assessment & Plan Note (Signed)
With the recent weight changes, I do think it important that we reassess the A1c. [name] did have a single past A1c that was at 6.6%, but then came down.He has not yet met ADA criteria for diabetes.

## 2022-06-14 NOTE — Assessment & Plan Note (Signed)
Blood pressure is in good control. Continue amlodipine 10 mg and lisinopril 5 mg daily.

## 2022-06-14 NOTE — Progress Notes (Signed)
[facility] PRIMARY [address] [place] [address] Dept: [phone] Dept Fax: [phone]  Chronic Care Office Visit  Subjective:    Patient ID: Edward Vasquez, male    DOB: January 28, 1966, 57 y.o..   MRN: [id number]  Chief Complaint  Patient presents with   Medical Management of Chronic Issues    3 month f/u.  Fasting today. No concerns.    History of Present Illness:  Patient is in today for reassessment of chronic medical issues.  [name] has a history of hypertension, currently managed with amlodipine 10 mg daily and lisinopril 5 mg daily. He continues to be out of work and without Copy. He is not exercising regularly and has noted his weight has come back up.  [name] also has a history of prediabetes.   He has a history of hyperlipidemia, managed with atorvastatin 20 mg daily.  [name] notes a 1-2 week history of some neck pain. He finds his neck pops a great deal as he moves this around. He is not having any upper arm issues.  Past Medical History: Patient Active Problem List   Diagnosis Date Noted   Neck pain, acute 06/14/2022   Morbid obesity with BMI of 40.0-44.9, adult ([place]) 02/08/2021   Vertigo 02/08/2021   History of anal fissures 11/08/2020   Prediabetes 08/01/2015   Hyperlipidemia 08/01/2015   Essential hypertension 06/28/2011   External hemorrhoids 05/29/2010   Past Surgical History:  Procedure Laterality Date   Fissurerectomy  12/2019   Family History  Problem Relation Age of Onset   Diabetes Mother    Hypertension Mother    Hypertension Father    Mental illness Sister    Hypertension Sister    Stroke Brother    Hypertension Brother    Heart disease Maternal Grandmother    Alcohol abuse Maternal Grandfather    Outpatient Medications Prior to Visit  Medication Sig Dispense Refill   amLODipine (NORVASC) 10 MG tablet Take 1 tablet (10 mg total) by mouth  daily. 90 tablet 3   atorvastatin (LIPITOR) 20 MG tablet Take 1 tablet (20 mg total) by mouth daily. 90 tablet 3   lisinopril (ZESTRIL) 5 MG tablet Take 1 tablet (5 mg total) by mouth daily. 90 tablet 3   meclizine (ANTIVERT) 25 MG tablet Take 1 tablet (25 mg total) by mouth 3 (three) times daily as needed for dizziness. 30 tablet 0   naproxen sodium (ALEVE) 220 MG tablet Take 220 mg by mouth daily as needed.     No facility-administered medications prior to visit.   No Known Allergies Objective:   Today's Vitals   06/14/22 0851  BP: 124/76  Pulse: (!) 55  Temp: 98.1 F (36.7 C)  TempSrc: Temporal  SpO2: 97%  Weight: 246 lb 12.8 oz (111.9 kg)  Height: '5\' 3"'$  (1.6 m)   Body mass index is 43.72 kg/m.   General: Well developed, well nourished. No acute distress. Neck: Supple. FROM. No crepitance Psych: Alert and oriented. Normal mood and affect.  Health Maintenance Due  Topic Date Due   Zoster Vaccines- Shingrix (1 of 2) Never done     Assessment & Plan:   Problem List Items Addressed This Visit       Cardiovascular and Mediastinum   Essential hypertension - Primary    Blood pressure is in good control. Continue amlodipine 10 mg and lisinopril 5 mg daily.      Relevant Orders   Basic metabolic  panel     Other   Prediabetes    With the recent weight changes, I do think it important that we reassess the A1c. [name] did have a single past A1c that was at 6.6%, but then came down.He has not yet met ADA criteria for diabetes.      Relevant Orders   Basic metabolic panel   Hemoglobin A1c   Hyperlipidemia    We will check lipids today. Continue atorvastatin 20 mg daily.      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 We discussed use of heat and stretches to help with discomfort.       Return in about 4 months (around 10/14/2022) for Reassessment.   Edward Salter, MD

## 2022-06-14 NOTE — Assessment & Plan Note (Signed)
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 We discussed use of heat and stretches to help with discomfort.

## 2022-06-14 NOTE — Assessment & Plan Note (Signed)
We will check lipids today. Continue atorvastatin 20 mg daily.

## 2022-06-17 ENCOUNTER — Other Ambulatory Visit: Payer: Self-pay | Admitting: Family Medicine

## 2022-06-17 DIAGNOSIS — R42 Dizziness and giddiness: Secondary | ICD-10-CM

## 2022-10-18 ENCOUNTER — Encounter: Payer: Self-pay | Admitting: Family Medicine

## 2022-10-18 ENCOUNTER — Ambulatory Visit (INDEPENDENT_AMBULATORY_CARE_PROVIDER_SITE_OTHER): Payer: BC Managed Care – PPO | Admitting: Family Medicine

## 2022-10-18 VITALS — BP 120/72 | HR 64 | Temp 97.1°F | Ht 63.0 in | Wt 250.4 lb

## 2022-10-18 DIAGNOSIS — S39011A Strain of muscle, fascia and tendon of abdomen, initial encounter: Secondary | ICD-10-CM | POA: Diagnosis not present

## 2022-10-18 DIAGNOSIS — I10 Essential (primary) hypertension: Secondary | ICD-10-CM | POA: Diagnosis not present

## 2022-10-18 DIAGNOSIS — E782 Mixed hyperlipidemia: Secondary | ICD-10-CM

## 2022-10-18 DIAGNOSIS — Z6841 Body Mass Index (BMI) 40.0 and over, adult: Secondary | ICD-10-CM

## 2022-10-18 MED ORDER — CYCLOBENZAPRINE HCL 10 MG PO TABS: 10.0000 mg | ORAL_TABLET | Freq: Three times a day (TID) | ORAL | 0 refills | Status: DC | PRN

## 2022-10-18 NOTE — Assessment & Plan Note (Signed)
Appears to be a strain of one of the abdominal oblique muscles. I recommend he use heat, relative rest,and consider naproxen 220 mg bid for 7 days. I will provide a muscle relaxer.

## 2022-10-18 NOTE — Assessment & Plan Note (Signed)
Weight is up an additional 4 lbs. We discussed that weight gain is not always due to the amount of food, but the types of food eaten. Discussed approaches to nutrient dense, low carb foods as a better way to eat. I also encouraged him to find ways to get some exercise beyond what he is doing at work.

## 2022-10-18 NOTE — Progress Notes (Signed)
[facility] PRIMARY CARE [facility] PRIMARY Trecia Rogers [facility] [address] [place] [address] Dept: [phone] Dept Fax: [phone]  Chronic Care Office Visit  Subjective:    Patient ID: Marqueis Shillingburg, male    DOB: 03-10-1966, 57 y.o..   MRN: [id number]  Chief Complaint  Patient presents with   Medical Management of Chronic Issues    4 month f/u.  No concerns.   Fasting today.     History of Present Illness:  Patient is in today for reassessment of chronic medical issues.  Mr. Hays has a history of hypertension, currently managed with amlodipine 10 mg daily and lisinopril 5 mg daily. He has started back to work in [employer]. He is not exercising regularly and continues to note weight gain. He states he doesn't know how this is happening, as he feels he doesn't eat that much food..   [name] also has a history of prediabetes.   He has a history of hyperlipidemia, managed with atorvastatin 20 mg daily.   [name] notes a 1 week history of right mid-back pain radiating around to his right flank. He states this started when he was having to hold up something at work above his head. He started to have pain in this area, but could not let down what he was holding. Now, he gets some twinges of pain in the flank, esp. with rotation.  Past Medical History: Patient Active Problem List   Diagnosis Date Noted   Neck pain, acute 06/14/2022   Morbid obesity with BMI of 40.0-44.9, adult (HCC) 02/08/2021   Vertigo 02/08/2021   History of anal fissures 11/08/2020   Prediabetes 08/01/2015   Hyperlipidemia 08/01/2015   Essential hypertension 06/28/2011   External hemorrhoids 05/29/2010   Past Surgical History:  Procedure Laterality Date   Fissurerectomy  12/2019   Family History  Problem Relation Age of Onset   Diabetes Mother    Hypertension Mother    Hypertension Father    Mental illness Sister    Hypertension Sister    Stroke Brother     Hypertension Brother    Heart disease Maternal Grandmother    Alcohol abuse Maternal Grandfather    Outpatient Medications Prior to Visit  Medication Sig Dispense Refill   amLODipine (NORVASC) 10 MG tablet Take 1 tablet (10 mg total) by mouth daily. 90 tablet 3   atorvastatin (LIPITOR) 20 MG tablet Take 1 tablet (20 mg total) by mouth daily. 90 tablet 3   lisinopril (ZESTRIL) 5 MG tablet Take 1 tablet (5 mg total) by mouth daily. 90 tablet 3   meclizine (ANTIVERT) 25 MG tablet TAKE 1 TABLET BY MOUTH THREE TIMES DAILY AS NEEDED FOR DIZZINESS 30 tablet 0   naproxen sodium (ALEVE) 220 MG tablet Take 220 mg by mouth daily as needed.     No facility-administered medications prior to visit.   No Known Allergies Objective:   Today's Vitals   10/18/22 0845  BP: 120/72  Pulse: 64  Temp: (!) 97.1 F (36.2 C)  TempSrc: Temporal  SpO2: 98%  Weight: 250 lb 6.4 oz (113.6 kg)  Height: 5\' 3"  (1.6 m)   Body mass index is 44.36 kg/m.   General: Well developed, well nourished. No acute distress. Back: Straight. Pain indicated over parathoracic muscles and into the upper obliques of the abdomen. Psych: Alert and oriented. Normal mood and affect.  Health Maintenance Due  Topic Date Due   Fecal DNA (Cologuard)  03/28/2021   Lab Results: Lab Results  Component Value Date   CHOL 128 06/14/2022   HDL 37.40 (L) 06/14/2022   LDLCALC 75 06/14/2022   TRIG 76.0 06/14/2022   CHOLHDL 3 06/14/2022      Assessment & Plan:   Problem List Items Addressed This Visit       Cardiovascular and Mediastinum   Essential hypertension - Primary    Blood pressure is in good control. Continue amlodipine 10 mg and lisinopril 5 mg daily.        Musculoskeletal and Integument   Strain of abdominal muscle    Appears to be a strain of one of the abdominal oblique muscles. I recommend he use heat, relative rest,and consider naproxen 220 mg bid for 7 days. I will provide a muscle relaxer.        Other    Hyperlipidemia    Lipids at goal. Continue atorvastatin 20 mg daily.      Morbid obesity with BMI of 40.0-44.9, adult (HCC)    Weight is up an additional 4 lbs. We discussed that weight gain is not always due to the amount of food, but the types of food eaten. Discussed approaches to nutrient dense, low carb foods as a better way to eat. I also encouraged him to find ways to get some exercise beyond what he is doing at work.       Return in about 3 months (around 01/18/2023) for Reassessment.   Loyola Mast, MD

## 2022-10-18 NOTE — Assessment & Plan Note (Signed)
Blood pressure is in good control. Continue amlodipine 10 mg and lisinopril 5 mg daily. 

## 2022-10-18 NOTE — Assessment & Plan Note (Signed)
Lipids at goal. Continue atorvastatin 20 mg daily. 

## 2023-01-24 ENCOUNTER — Ambulatory Visit (INDEPENDENT_AMBULATORY_CARE_PROVIDER_SITE_OTHER): Payer: BC Managed Care – PPO | Admitting: Family Medicine

## 2023-01-24 ENCOUNTER — Encounter: Payer: Self-pay | Admitting: Family Medicine

## 2023-01-24 VITALS — BP 134/72 | HR 68 | Temp 98.3°F | Ht 63.0 in | Wt 245.8 lb

## 2023-01-24 DIAGNOSIS — E782 Mixed hyperlipidemia: Secondary | ICD-10-CM | POA: Diagnosis not present

## 2023-01-24 DIAGNOSIS — Z1211 Encounter for screening for malignant neoplasm of colon: Secondary | ICD-10-CM

## 2023-01-24 DIAGNOSIS — I10 Essential (primary) hypertension: Secondary | ICD-10-CM

## 2023-01-24 DIAGNOSIS — Z6841 Body Mass Index (BMI) 40.0 and over, adult: Secondary | ICD-10-CM

## 2023-01-24 DIAGNOSIS — M542 Cervicalgia: Secondary | ICD-10-CM

## 2023-01-24 MED ORDER — CYCLOBENZAPRINE HCL 10 MG PO TABS: 10.0000 mg | ORAL_TABLET | Freq: Three times a day (TID) | ORAL | 0 refills | Status: AC | PRN

## 2023-01-24 MED ORDER — AMLODIPINE BESYLATE 10 MG PO TABS: 10.0000 mg | ORAL_TABLET | Freq: Every day | ORAL | 3 refills | Status: DC

## 2023-01-24 MED ORDER — LISINOPRIL 5 MG PO TABS: 5.0000 mg | ORAL_TABLET | Freq: Every day | ORAL | 3 refills | Status: DC

## 2023-01-24 NOTE — Progress Notes (Signed)
[facility] PRIMARY CARE [facility] PRIMARY Trecia Rogers [facility] [address] [place] [address] Dept: [phone] Dept Fax: [phone]  Chronic Care Office Visit  Subjective:    Patient ID: Edward Vasquez, male    DOB: [birth year], 57 y.o..   MRN: [id number]  Chief Complaint  Patient presents with   Hypertension    3 month f/u HTN.  No concerns.     History of Present Illness:  Patient is in today for reassessment of chronic medical issues.  Edward Vasquez has a history of hypertension, currently managed with amlodipine 10 mg daily and lisinopril 5 mg daily. 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 He notes he is very active daily, however he is concerned about his weight.   [name] also has a history of prediabetes.   He has a history of hyperlipidemia, managed with atorvastatin 20 mg daily.   Past Medical History: Patient Active Problem List   Diagnosis Date Noted   Strain of abdominal muscle 10/18/2022   Neck pain, acute 06/14/2022   Morbid obesity with BMI of 40.0-44.9, adult (HCC) 02/08/2021   Vertigo 02/08/2021   History of anal fissures 11/08/2020   Prediabetes 08/01/2015   Hyperlipidemia 08/01/2015   Essential hypertension 06/28/2011   External hemorrhoids 05/29/2010   Past Surgical History:  Procedure Laterality Date   Fissurerectomy  12/2019   Family History  Problem Relation Age of Onset   Diabetes Mother    Hypertension Mother    Hypertension Father    Mental illness Sister    Hypertension Sister    Stroke Brother    Hypertension Brother    Heart disease Maternal Grandmother    Alcohol abuse Maternal Grandfather    Outpatient Medications Prior to Visit  Medication Sig Dispense Refill   atorvastatin (LIPITOR) 20 MG tablet Take 1 tablet (20 mg total) by mouth daily. 90 tablet 3   meclizine (ANTIVERT) 25 MG tablet TAKE 1 TABLET BY MOUTH THREE TIMES DAILY AS NEEDED FOR DIZZINESS 30 tablet 0    amLODipine (NORVASC) 10 MG tablet Take 1 tablet (10 mg total) by mouth daily. 90 tablet 3   cyclobenzaprine (FLEXERIL) 10 MG tablet Take 1 tablet (10 mg total) by mouth 3 (three) times daily as needed for muscle spasms. 30 tablet 0   lisinopril (ZESTRIL) 5 MG tablet Take 1 tablet (5 mg total) by mouth daily. 90 tablet 3   No facility-administered medications prior to visit.   No Known Allergies Objective:   Today's Vitals   01/24/23 0828  BP: 134/72  Pulse: 68  Temp: 98.3 F (36.8 C)  TempSrc: Temporal  SpO2: 95%  Weight: 245 lb 12.8 oz (111.5 kg)  Height: 5\' 3"  (1.6 m)   Body mass index is 43.54 kg/m.   General: Well developed, well nourished. No acute distress. Psych: Alert and oriented. Normal mood and affect.  Health Maintenance Due  Topic Date Due   Fecal DNA (Cologuard)  03/28/2021     Assessment & Plan:   Problem List Items Addressed This Visit       Cardiovascular and Mediastinum   Essential hypertension - Primary    Blood pressure is in adequate control. Continue amlodipine 10 mg and lisinopril 5 mg daily.      Relevant Medications   amLODipine (NORVASC) 10 MG tablet   lisinopril (ZESTRIL) 5 MG tablet     Other   Hyperlipidemia    Lipids at goal. Continue atorvastatin 20 mg daily.  Relevant Medications   amLODipine (NORVASC) 10 MG tablet   lisinopril (ZESTRIL) 5 MG tablet   Morbid obesity with BMI of 40.0-44.9, adult (HCC)    Weight is stable. We have previously discussed dietary and exercise approaches to weight loss. Edward Vasquez obesity is complicated by hypertension, hyperlipidemia, and prediabetes. His truncal obesity places him at high risk for CV disease. I recommended he check with his insurance to see if he might be a candidate for using a GLP-1 RA for weight loss.      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 Continue use of heat and stretches to help with discomfort. I will renew his cyclobenzaprine for periodic  use.      Relevant Medications   cyclobenzaprine (FLEXERIL) 10 MG tablet   Other Visit Diagnoses     Screening for colon cancer       Relevant Orders   Cologuard      Return in about 4 months (around 05/27/2023) for Reassessment.   Loyola Mast, MD

## 2023-01-24 NOTE — Assessment & Plan Note (Signed)
Blood pressure is in adequate control. Continue amlodipine 10 mg and lisinopril 5 mg daily.

## 2023-01-24 NOTE — Assessment & Plan Note (Signed)
Weight is stable. We have previously discussed dietary and exercise approaches to weight loss. [name] obesity is complicated by hypertension, hyperlipidemia, and prediabetes. His truncal obesity places him at high risk for CV disease. I recommended he check with his insurance to see if he might be a candidate for using a GLP-1 RA for weight loss.

## 2023-01-24 NOTE — Assessment & Plan Note (Signed)
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 Continue use of heat and stretches to help with discomfort. I will renew his cyclobenzaprine for periodic use.

## 2023-01-24 NOTE — Assessment & Plan Note (Signed)
Lipids at goal. Continue atorvastatin 20 mg daily.

## 2023-02-21 LAB — COLOGUARD: COLOGUARD: NEGATIVE

## 2023-03-12 ENCOUNTER — Other Ambulatory Visit: Payer: Self-pay | Admitting: Family Medicine

## 2023-03-12 DIAGNOSIS — E782 Mixed hyperlipidemia: Secondary | ICD-10-CM

## 2023-05-30 ENCOUNTER — Encounter: Payer: Self-pay | Admitting: Family Medicine

## 2023-05-30 ENCOUNTER — Ambulatory Visit: Payer: BC Managed Care – PPO | Admitting: Family Medicine

## 2023-05-30 VITALS — BP 124/76 | HR 93 | Temp 98.1°F | Ht 63.0 in | Wt 241.8 lb

## 2023-05-30 DIAGNOSIS — M23312 Other meniscus derangements, anterior horn of medial meniscus, left knee: Secondary | ICD-10-CM | POA: Diagnosis not present

## 2023-05-30 DIAGNOSIS — E782 Mixed hyperlipidemia: Secondary | ICD-10-CM

## 2023-05-30 DIAGNOSIS — I10 Essential (primary) hypertension: Secondary | ICD-10-CM | POA: Diagnosis not present

## 2023-05-30 DIAGNOSIS — R7303 Prediabetes: Secondary | ICD-10-CM

## 2023-05-30 LAB — BASIC METABOLIC PANEL
BUN: 13 mg/dL (ref 6–23)
CO2: 25 meq/L (ref 19–32)
Calcium: 9 mg/dL (ref 8.4–10.5)
Chloride: 107 meq/L (ref 96–112)
Creatinine, Ser: 0.64 mg/dL (ref 0.40–1.50)
GFR: 106.77 mL/min (ref >60.00)
Glucose, Bld: 96 mg/dL (ref 70–99)
Potassium: 4 meq/L (ref 3.5–5.1)
Sodium: 139 meq/L (ref 135–145)

## 2023-05-30 LAB — LIPID PANEL
Cholesterol: 103 mg/dL (ref 0–200)
HDL: 32.3 mg/dL — ABNORMAL LOW (ref >39.00)
LDL Cholesterol: 61 mg/dL (ref 0–99)
NonHDL: 70.91
Total CHOL/HDL Ratio: 3
Triglycerides: 48 mg/dL (ref 0.0–149.0)
VLDL: 9.6 mg/dL (ref 0.0–40.0)

## 2023-05-30 LAB — HEMOGLOBIN A1C: Hgb A1c MFr Bld: 6 % (ref 4.6–6.5)

## 2023-05-30 MED ORDER — ATORVASTATIN CALCIUM 20 MG PO TABS: 20.0000 mg | ORAL_TABLET | Freq: Every day | ORAL | 3 refills | Status: AC

## 2023-05-30 NOTE — Assessment & Plan Note (Signed)
I will check lipids today. Continue atorvastatin 20 mg daily.

## 2023-05-30 NOTE — Assessment & Plan Note (Signed)
Suspect some cartilage issues in the knee. We will manage this expectantly for now. If this persists, he may need a steroid injection to calm this down.

## 2023-05-30 NOTE — Assessment & Plan Note (Addendum)
Blood pressure is in good control. Continue amlodipine 10 mg and lisinopril 5 mg daily. I will check annual renal labs.

## 2023-05-30 NOTE — Assessment & Plan Note (Signed)
I will reassess his A1c today.

## 2023-05-30 NOTE — Progress Notes (Signed)
[facility] PRIMARY CARE LB PRIMARY Trecia Rogers [facility] [address] [place] [address] Dept: [phone] Dept Fax: [phone]  Chronic Care Office Visit  Subjective:    Patient ID: Edward Vasquez, male    DOB: Jun 22, 1965, 59 y.o..   MRN: [id number]  Chief Complaint  Patient presents with   Hypertension    4 month f/u.  Non concerns.  Fasting today.    History of Present Illness:  Patient is in today for reassessment of chronic medical issues.  Mr. Ishman has a history of hypertension, currently managed with amlodipine 10 mg daily and lisinopril 5 mg daily. His weight has gradually declined over the past 6 months. He is uncertain why, but he has been back to work now for about that time period.   [name] also has a history of prediabetes.   He has a history of hyperlipidemia, managed with atorvastatin 20 mg daily.  [name] notes some popping and intermittent pain in the anteromedial aspect of this left knee. He notes he had a similar problem with his right knee 10 years ago. He had more pain with the right knee and was given a steroid injection, which resolved the issue.  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 05/30/2023   Strain of abdominal muscle 10/18/2022   Neck pain, acute 06/14/2022   Morbid obesity with BMI of 40.0-44.9, adult (HCC) 02/08/2021   Vertigo 02/08/2021   History of anal fissures 11/08/2020   Prediabetes 08/01/2015   Hyperlipidemia 08/01/2015   Essential hypertension 06/28/2011   External hemorrhoids 05/29/2010   Past Surgical History:  Procedure Laterality Date   Fissurerectomy  12/2019   Family History  Problem Relation Age of Onset   Diabetes Mother    Hypertension Mother    Hypertension Father    Mental illness Sister    Hypertension Sister    Stroke Brother    Hypertension Brother     Heart disease Maternal Grandmother    Alcohol abuse Maternal Grandfather    Outpatient Medications Prior to Visit  Medication Sig Dispense Refill   amLODipine (NORVASC) 10 MG tablet Take 1 tablet (10 mg total) by mouth daily. 90 tablet 3   atorvastatin (LIPITOR) 20 MG tablet Take 1 tablet by mouth once daily 90 tablet 0   cyclobenzaprine (FLEXERIL) 10 MG tablet Take 1 tablet (10 mg total) by mouth 3 (three) times daily as needed for muscle spasms. 30 tablet 0   lisinopril (ZESTRIL) 5 MG tablet Take 1 tablet (5 mg total) by mouth daily. 90 tablet 3   meclizine (ANTIVERT) 25 MG tablet TAKE 1 TABLET BY MOUTH THREE TIMES DAILY AS NEEDED FOR DIZZINESS 30 tablet 0   No facility-administered medications prior to visit.   No Known Allergies Objective:   Today's Vitals   05/30/23 0833  BP: 124/76  Pulse: 93  Temp: 98.1 F (36.7 C)  TempSrc: Temporal  SpO2: 96%  Weight: 241 lb 12.8 oz (109.7 kg)  Height: 5\' 3"  (1.6 m)   Body mass index is 42.83 kg/m.   General: Well developed, well nourished. No acute distress. Extremities: Full ROM. No joint swelling. There is a recurrent clicking along the anteromedial joint line.  +/- tenderness.  Psych: Alert and oriented. Normal mood and affect.  There are no preventive care reminders to display for this patient.    Assessment & Plan:   Problem List Items  Addressed This Visit       Cardiovascular and Mediastinum   Essential hypertension - Primary   Blood pressure is in good control. Continue amlodipine 10 mg and lisinopril 5 mg daily. I will check annual renal labs.      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 We will manage this expectantly for now. If this persists, he may need a steroid injection to calm this  down.        Other   Hyperlipidemia   I will check lipids today. Continue atorvastatin 20 mg daily.      Relevant Medications   atorvastatin (LIPITOR) 20 MG tablet   Other Relevant Orders   Lipid panel   Prediabetes   I will reassess his A1c today.      Relevant Orders   Hemoglobin A1c   Basic metabolic panel    Return in about 4 months (around 09/27/2023) for Reassessment.   Loyola Mast, MD

## 2023-09-26 ENCOUNTER — Ambulatory Visit (INDEPENDENT_AMBULATORY_CARE_PROVIDER_SITE_OTHER): Payer: BC Managed Care – PPO | Admitting: Family Medicine

## 2023-09-26 ENCOUNTER — Encounter: Payer: Self-pay | Admitting: Family Medicine

## 2023-09-26 VITALS — BP 124/72 | HR 60 | Temp 97.0°F | Ht 63.0 in | Wt 240.2 lb

## 2023-09-26 DIAGNOSIS — M79671 Pain in right foot: Secondary | ICD-10-CM | POA: Diagnosis not present

## 2023-09-26 DIAGNOSIS — I10 Essential (primary) hypertension: Secondary | ICD-10-CM

## 2023-09-26 DIAGNOSIS — E782 Mixed hyperlipidemia: Secondary | ICD-10-CM

## 2023-09-26 DIAGNOSIS — R42 Dizziness and giddiness: Secondary | ICD-10-CM

## 2023-09-26 MED ORDER — MECLIZINE HCL 25 MG PO TABS: 25.0000 mg | ORAL_TABLET | Freq: Two times a day (BID) | ORAL | 2 refills | Status: AC | PRN

## 2023-09-26 NOTE — Assessment & Plan Note (Signed)
 Episodic. Will continue meclizine  25 mg as needed.

## 2023-09-26 NOTE — Progress Notes (Signed)
 [facility] PRIMARY CARE LB PRIMARY Ethel Henry [facility] [address] [place] [address] Dept: [phone] Dept Fax: [phone]  Chronic Care Office Visit  Subjective:    Patient ID: Edward Vasquez, male    DOB: 1966-02-17, 58 y.o..   MRN: [id number]  Chief Complaint  Patient presents with   Hypertension    4 month f/u HTN.  No concerns.    History of Present Illness:  Patient is in today for reassessment of chronic medical issues.  Mr. Noorani has a history of hypertension. He is managed with amlodipine  10 mg daily and lisinopril  5 mg daily. His weight has gradually declined over the past year, likely due to being back to work.   He has a history of hyperlipidemia, managed with atorvastatin  20 mg daily.  Mr. Moffa has episodic vertigo. He notes his last episode was several weeks ago. It had been months since he had it before this. He likes to keep some meclizine  on hand for when these occur.  Mr. Lindner notes yesterday he stepped on a wire with his right foot. He feels some discomfort there. He asks if I can look at this.  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 05/30/2023   Strain of abdominal muscle 10/18/2022   Neck pain, acute 06/14/2022   Morbid obesity with BMI of 40.0-44.9, adult (HCC) 02/08/2021   Vertigo 02/08/2021   History of anal fissures 11/08/2020   Prediabetes 08/01/2015   Hyperlipidemia 08/01/2015   Essential hypertension 06/28/2011   External hemorrhoids 05/29/2010   Past Surgical History:  Procedure Laterality Date   Fissurerectomy  12/2019   Family History  Problem Relation Age of Onset   Diabetes Mother    Hypertension Mother    Hypertension Father    Mental illness Sister    Hypertension Sister    Stroke Brother    Hypertension Brother    Heart disease Maternal Grandmother     Alcohol abuse Maternal Grandfather    Outpatient Medications Prior to Visit  Medication Sig Dispense Refill   amLODipine  (NORVASC ) 10 MG tablet Take 1 tablet (10 mg total) by mouth daily. 90 tablet 3   atorvastatin  (LIPITOR) 20 MG tablet Take 1 tablet (20 mg total) by mouth daily. 90 tablet 3   cyclobenzaprine  (FLEXERIL ) 10 MG tablet Take 1 tablet (10 mg total) by mouth 3 (three) times daily as needed for muscle spasms. 30 tablet 0   lisinopril  (ZESTRIL ) 5 MG tablet Take 1 tablet (5 mg total) by mouth daily. 90 tablet 3   meclizine  (ANTIVERT ) 25 MG tablet TAKE 1 TABLET BY MOUTH THREE TIMES DAILY AS NEEDED FOR DIZZINESS 30 tablet 0   No facility-administered medications prior to visit.   No Known Allergies Objective:   Today's Vitals   09/26/23 0803  BP: 124/72  Pulse: 60  Temp: (!) 97 F (36.1 C)  TempSrc: Temporal  SpO2: 97%  Weight: 240 lb 3.2 oz (109 kg)  Height: 5' 3 (1.6 m)   Body mass index is 42.55 kg/m.   General: Well developed, well nourished. No acute distress. Foot: The sole of the right foot is intact and I see no wound. There is no redness or swelling. Psych: Alert and oriented. Normal mood and affect.  There are no preventive care reminders to display for this patient.    Assessment & Plan:   Problem List Items Addressed This Visit  Cardiovascular and Mediastinum   Essential hypertension - Primary   Blood pressure is in good control. Continue amlodipine  10 mg and lisinopril  5 mg daily.         Other   Hyperlipidemia   Vertigo   Episodic. Will continue meclizine  25 mg as needed.      Relevant Medications   meclizine  (ANTIVERT ) 25 MG tablet   Other Visit Diagnoses       Right foot pain       Reassured him this appears to be normal today. He shodul watch for redness, swelling, or increased pain in the foot. RTC if this occurs.       Return in about 4 months (around 01/26/2024) for Reassessment.   Graig Lawyer, MD

## 2023-09-26 NOTE — Assessment & Plan Note (Signed)
Blood pressure is in good control. Continue amlodipine 10 mg and lisinopril 5 mg daily. 

## 2024-01-30 ENCOUNTER — Ambulatory Visit

## 2024-01-30 ENCOUNTER — Ambulatory Visit (INDEPENDENT_AMBULATORY_CARE_PROVIDER_SITE_OTHER): Admitting: Family Medicine

## 2024-01-30 ENCOUNTER — Ambulatory Visit: Payer: Self-pay | Admitting: Family Medicine

## 2024-01-30 ENCOUNTER — Encounter: Payer: Self-pay | Admitting: Family Medicine

## 2024-01-30 VITALS — BP 124/76 | HR 65 | Temp 97.0°F | Ht 63.0 in | Wt 245.4 lb

## 2024-01-30 DIAGNOSIS — R10A1 Flank pain, right side: Secondary | ICD-10-CM

## 2024-01-30 DIAGNOSIS — I10 Essential (primary) hypertension: Secondary | ICD-10-CM

## 2024-01-30 DIAGNOSIS — S29011A Strain of muscle and tendon of front wall of thorax, initial encounter: Secondary | ICD-10-CM

## 2024-01-30 DIAGNOSIS — E782 Mixed hyperlipidemia: Secondary | ICD-10-CM | POA: Diagnosis not present

## 2024-01-30 DIAGNOSIS — R0789 Other chest pain: Secondary | ICD-10-CM | POA: Diagnosis not present

## 2024-01-30 LAB — CBC WITH DIFFERENTIAL/PLATELET
Basophils Absolute: 0.1 K/uL (ref 0.0–0.1)
Basophils Relative: 1 % (ref 0.0–3.0)
Eosinophils Absolute: 0.2 K/uL (ref 0.0–0.7)
Eosinophils Relative: 3.3 % (ref 0.0–5.0)
HCT: 43.5 % (ref 39.0–52.0)
Hemoglobin: 14.6 g/dL (ref 13.0–17.0)
Lymphocytes Relative: 31.6 % (ref 12.0–46.0)
Lymphs Abs: 1.7 K/uL (ref 0.7–4.0)
MCHC: 33.6 g/dL (ref 30.0–36.0)
MCV: 93.9 fl (ref 78.0–100.0)
Monocytes Absolute: 0.5 K/uL (ref 0.1–1.0)
Monocytes Relative: 9.6 % (ref 3.0–12.0)
Neutro Abs: 2.9 K/uL (ref 1.4–7.7)
Neutrophils Relative %: 54.5 % (ref 43.0–77.0)
Platelets: 284 K/uL (ref 150.0–400.0)
RBC: 4.64 Mil/uL (ref 4.22–5.81)
RDW: 14.3 % (ref 11.5–15.5)
WBC: 5.3 K/uL (ref 4.0–10.5)

## 2024-01-30 LAB — COMPREHENSIVE METABOLIC PANEL WITH GFR
ALT: 24 U/L (ref 0–53)
AST: 22 U/L (ref 0–37)
Albumin: 4.5 g/dL (ref 3.5–5.2)
Alkaline Phosphatase: 67 U/L (ref 39–117)
BUN: 15 mg/dL (ref 6–23)
CO2: 27 meq/L (ref 19–32)
Calcium: 8.8 mg/dL (ref 8.4–10.5)
Chloride: 107 meq/L (ref 96–112)
Creatinine, Ser: 0.65 mg/dL (ref 0.40–1.50)
GFR: 105.77 mL/min (ref >60.00)
Glucose, Bld: 94 mg/dL (ref 70–99)
Potassium: 3.9 meq/L (ref 3.5–5.1)
Sodium: 140 meq/L (ref 135–145)
Total Bilirubin: 1 mg/dL (ref 0.2–1.2)
Total Protein: 7.3 g/dL (ref 6.0–8.3)

## 2024-01-30 LAB — LIPASE: Lipase: 15 U/L (ref 11.0–59.0)

## 2024-01-30 MED ORDER — AMLODIPINE BESYLATE 10 MG PO TABS: 10.0000 mg | ORAL_TABLET | Freq: Every day | ORAL | 3 refills | Status: AC

## 2024-01-30 MED ORDER — LISINOPRIL 5 MG PO TABS: 5.0000 mg | ORAL_TABLET | Freq: Every day | ORAL | 3 refills | Status: AC

## 2024-01-30 NOTE — Assessment & Plan Note (Signed)
Blood pressure is in good control. Continue amlodipine 10 mg and lisinopril 5 mg daily. 

## 2024-01-30 NOTE — Assessment & Plan Note (Signed)
 LDL cholesterol is at goal. Continue atorvastatin  20 mg daily.

## 2024-01-30 NOTE — Progress Notes (Signed)
 [facility] PRIMARY CARE LB PRIMARY SABAS CORY MOSELLE [facility] [address] [place] [address] Dept: [phone] Dept Fax: [phone]  Chronic Care Office Visit  Subjective:    Patient ID: Edward Vasquez, male    DOB: 20-May-1965, 58 y.o..   MRN: [id number]  Chief Complaint  Patient presents with   Hypertension    4 month f/u HTN.  Declines flu shot.  C/o sore knees and cramps on the RT side.    History of Present Illness:  Patient is in today for reassessment of chronic medical issues.  Mr. Dacosta has a history of hypertension. He is managed with amlodipine  10 mg daily and lisinopril  5 mg daily.    He has a history of hyperlipidemia, managed with atorvastatin  20 mg daily.   Mr. Karaffa notes he has been having a crampy pain in his right flank over the lower rib area. This has been present for the past year. He does recall this started when he was doing work overhead, with his arms lifted high. He finds that certain movements do tend to make this worse. He has heartburn issues, but no nausea/vomiting. He bowel movements have been normal.  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 05/30/2023   Strain of abdominal muscle 10/18/2022   Neck pain, acute 06/14/2022   Morbid obesity with BMI of 40.0-44.9, adult (HCC) 02/08/2021   Vertigo 02/08/2021   History of anal fissures 11/08/2020   Prediabetes 08/01/2015   Hyperlipidemia 08/01/2015   Essential hypertension 06/28/2011   External hemorrhoids 05/29/2010   Past Surgical History:  Procedure Laterality Date   Fissurerectomy  12/2019   Family History  Problem Relation Age of Onset   Diabetes Mother    Hypertension Mother    Hypertension Father    Mental illness Sister    Hypertension Sister    Stroke Brother    Hypertension Brother    Heart disease Maternal Grandmother     Alcohol abuse Maternal Grandfather    Outpatient Medications Prior to Visit  Medication Sig Dispense Refill   atorvastatin  (LIPITOR) 20 MG tablet Take 1 tablet (20 mg total) by mouth daily. 90 tablet 3   cyclobenzaprine  (FLEXERIL ) 10 MG tablet Take 1 tablet (10 mg total) by mouth 3 (three) times daily as needed for muscle spasms. 30 tablet 0   meclizine  (ANTIVERT ) 25 MG tablet Take 1 tablet (25 mg total) by mouth 2 (two) times daily as needed for dizziness. 30 tablet 2   amLODipine  (NORVASC ) 10 MG tablet Take 1 tablet (10 mg total) by mouth daily. 90 tablet 3   lisinopril  (ZESTRIL ) 5 MG tablet Take 1 tablet (5 mg total) by mouth daily. 90 tablet 3   No facility-administered medications prior to visit.   No Known Allergies Objective:   Today's Vitals   01/30/24 0800  BP: 124/76  Pulse: 65  Temp: (!) 97 F (36.1 C)  TempSrc: Temporal  SpO2: 97%  Weight: 245 lb 6.4 oz (111.3 kg)  Height: 5' 3 (1.6 m)   Body mass index is 43.47 kg/m.   General: Well developed, well nourished. No acute distress. Lungs: Clear to auscultation bilaterally. No wheezing, rales or rhonchi. Chest: There is mild-moderate tenderness with palpation over the lower right rib cage in the mid-axillary line. No swelling   noted. Abdomen: Soft, non-tender. Bowel sounds positive, normal pitch and frequency. No hepatosplenomegaly. No rebound or   guarding.  Murphy's sign is negative. Psych: Alert and oriented. Normal mood and affect.  Health Maintenance Due  Topic Date Due   Hepatitis B Vaccines 19-59 Average Risk (1 of 3 - 19+ 3-dose series) Never done   Pneumococcal Vaccine: 50+ Years (1 of 1 - PCV) Never done     Assessment & Plan:   Problem List Items Addressed This Visit       Cardiovascular and Mediastinum   Essential hypertension - Primary   Blood pressure is in good control. Continue amlodipine  10 mg and lisinopril  5 mg daily.       Relevant Medications   lisinopril  (ZESTRIL ) 5 MG tablet    amLODipine  (NORVASC ) 10 MG tablet     Other   Hyperlipidemia   LDL cholesterol is at goal. Continue atorvastatin  20 mg daily.      Relevant Medications   lisinopril  (ZESTRIL ) 5 MG tablet   amLODipine  (NORVASC ) 10 MG tablet   Other Visit Diagnoses       Right flank pain       Apepars to be a muscle wall strain. I will check a CXR and labs to rule out other causes. If tests are normal, plan a PT referral.   Relevant Orders   DG Chest 2 View   CBC with Differential/Platelet   Comprehensive metabolic panel with GFR   Lipase       Return in about 3 months (around 05/01/2024) for Reassessment.   Garnette CHRISTELLA Simpler, MD
